# Patient Record
Sex: Female | Born: 1995 | Race: Black or African American | Hispanic: No | Marital: Single | Smoking: Former smoker
Health system: Southern US, Community
[De-identification: ages and names within clinical notes are randomized; demographics above are authoritative.]

## PROBLEM LIST (undated history)

## (undated) ENCOUNTER — Inpatient Hospital Stay (HOSPITAL_COMMUNITY): Payer: Self-pay

## (undated) ENCOUNTER — Emergency Department (HOSPITAL_COMMUNITY): Payer: MEDICAID

## (undated) DIAGNOSIS — E0590 Thyrotoxicosis, unspecified without thyrotoxic crisis or storm: Secondary | ICD-10-CM

## (undated) DIAGNOSIS — R011 Cardiac murmur, unspecified: Secondary | ICD-10-CM

## (undated) DIAGNOSIS — F419 Anxiety disorder, unspecified: Secondary | ICD-10-CM

## (undated) DIAGNOSIS — A599 Trichomoniasis, unspecified: Secondary | ICD-10-CM

## (undated) DIAGNOSIS — E079 Disorder of thyroid, unspecified: Secondary | ICD-10-CM

## (undated) HISTORY — PX: NO PAST SURGERIES: SHX2092

## (undated) HISTORY — DX: Anxiety disorder, unspecified: F41.9

---
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
## 2015-01-16 NOTE — L&D Delivery Note (Signed)
AROM'd shortly before delivery - moderate meconium. Reassuring FHRT throughout 2nd stage, occ variables.  Delivery Note At 12:52 AM a viable female "Ky'mier" was delivered via Vaginal, Spontaneous Delivery (Presentation: OA restituting to ROA). APGARS: 8, 9; weight pending.   Placenta status: Complete, intact. Cord: 3 vessels with the following complications: None. Cord pH: NA  Anesthesia: Epidural Episiotomy: None Lacerations: Right inner labial   Suture Repair: 3.0 vicryl SH Est. Blood Loss (mL): 300  Mom to postpartum.  Baby to Couplet care / Skin to Skin.  Plans outpatient circumcision.  Breast and bottle feeding.  Desires depo for contraception (ordered) - previous user.   Sherre ScarletWILLIAMS, Anaisha Mago 11/22/2015, 1:28 AM

## 2015-03-29 ENCOUNTER — Encounter (HOSPITAL_COMMUNITY): Payer: Self-pay

## 2015-03-29 ENCOUNTER — Inpatient Hospital Stay (HOSPITAL_COMMUNITY)
Admission: AD | Admit: 2015-03-29 | Discharge: 2015-03-29 | Disposition: A | Discharge status: Home / Self Care | Payer: Medicaid Other | Source: Ambulatory Visit | Attending: Obstetrics & Gynecology | Admitting: Obstetrics & Gynecology

## 2015-03-29 DIAGNOSIS — Z3201 Encounter for pregnancy test, result positive: Secondary | ICD-10-CM | POA: Insufficient documentation

## 2015-03-29 DIAGNOSIS — Z3200 Encounter for pregnancy test, result unknown: Secondary | ICD-10-CM | POA: Diagnosis present

## 2015-03-29 DIAGNOSIS — O219 Vomiting of pregnancy, unspecified: Secondary | ICD-10-CM | POA: Diagnosis not present

## 2015-03-29 LAB — URINALYSIS, ROUTINE W REFLEX MICROSCOPIC
Bilirubin Urine: NEGATIVE
Glucose, UA: NEGATIVE mg/dL
Hgb urine dipstick: NEGATIVE
Ketones, ur: NEGATIVE mg/dL
LEUKOCYTES UA: NEGATIVE
NITRITE: NEGATIVE
PH: 6.5 (ref 5.0–8.0)
Protein, ur: NEGATIVE mg/dL
Specific Gravity, Urine: 1.02 (ref 1.005–1.030)

## 2015-03-29 LAB — POCT PREGNANCY, URINE: Preg Test, Ur: POSITIVE — AB

## 2015-03-29 NOTE — MAU Provider Note (Signed)
Mackenzie Santiago is a 20 y.o. G1P0 at 125w4dwho presents to MAU today for pregnancy verification. The patient denies abdominal pain or vaginal bleedingcurrently.  Just reports mild nausea, had mild RLQ earlier today but no current symptoms.  Blood pressure 131/78, pulse 106, temperature 98.6 F (37 C), temperature source Oral, resp. rate 18, last menstrual period 02/18/2015. CONSTITUTIONAL: Well-developed, well-nourished female in no acute distress.  ENT: External right and left ear normal.  EYES: EOM intact, conjunctivae normal.  MUSCULOSKELETAL: Normal range of motion.  CARDIOVASCULAR: Regular heart rate RESPIRATORY: Normal effort NEUROLOGICAL: Alert and oriented to person, place, and time.  SKIN: Skin is warm and dry. No rash noted. Not diaphoretic. No erythema. No pallor. PSYCH: Normal mood and affect. Normal behavior. Normal judgment and thought content.  Results for orders placed or performed during the hospital encounter of 03/29/15 (from the past 24 hour(s))  Urinalysis, Routine w reflex microscopic ([facility])     Status: Abnormal   Collection Time: 03/29/15  6:20 PM  Result Value Ref Range   Color, Urine YELLOW YELLOW   APPearance HAZY (A) CLEAR   Specific Gravity, Urine 1.020 1.005 - 1.030   pH 6.5 5.0 - 8.0   Glucose, UA NEGATIVE NEGATIVE mg/dL   Hgb urine dipstick NEGATIVE NEGATIVE   Bilirubin Urine NEGATIVE NEGATIVE   Ketones, ur NEGATIVE NEGATIVE mg/dL   Protein, ur NEGATIVE NEGATIVE mg/dL   Nitrite NEGATIVE NEGATIVE   Leukocytes, UA NEGATIVE NEGATIVE  Pregnancy, urine POC     Status: Abnormal   Collection Time: 03/29/15  6:40 PM  Result Value Ref Range   Preg Test, Ur POSITIVE (A) NEGATIVE     A:Positive pregnancy test, benign exam  P:  Discharge home Pregnancy confirmation letter and list of area OB providers given Patient advised to start taking prenatal vitamins Recommended OTC Unisom and Vitamin B6 or nausea First trimester warning signs  reviewed Patient may return to MAU as needed or if her condition were to change or worsen    Tereso NewcomerUgonna A [name], MD 03/29/2015 7:53 PM

## 2015-03-29 NOTE — MAU Note (Addendum)
Pt took 4 HPT's, all positive.  C/O nausea since yesterday, no vomiting or diarrhea.  RLQ pain also since this morning. Denies this pain now.

## 2015-03-29 NOTE — Discharge Instructions (Signed)
First Trimester of Pregnancy The first trimester of pregnancy is from week 1 until the end of week 12 (months 1 through 3). A week after a sperm fertilizes an egg, the egg will implant on the wall of the uterus. This embryo will begin to develop into a baby. Genes from you and your partner are forming the baby. The female genes determine whether the baby is a boy or a girl. At 6-8 weeks, the eyes and face are formed, and the heartbeat can be seen on ultrasound. At the end of 12 weeks, all the baby's organs are formed.  Now that you are pregnant, you will want to do everything you can to have a healthy baby. Two of the most important things are to get good prenatal care and to follow your health care provider's instructions. Prenatal care is all the medical care you receive before the baby's birth. This care will help prevent, find, and treat any problems during the pregnancy and childbirth. BODY CHANGES Your body goes through many changes during pregnancy. The changes vary from woman to woman.   You may gain or lose a couple of pounds at first.  You may feel sick to your stomach (nauseous) and throw up (vomit). If the vomiting is uncontrollable, call your health care provider.  You may tire easily.  You may develop headaches that can be relieved by medicines approved by your health care provider.  You may urinate more often. Painful urination may mean you have a bladder infection.  You may develop heartburn as a result of your pregnancy.  You may develop constipation because certain hormones are causing the muscles that push waste through your intestines to slow down.  You may develop hemorrhoids or swollen, bulging veins (varicose veins).  Your breasts may begin to grow larger and become tender. Your nipples may stick out more, and the tissue that surrounds them (areola) may become darker.  Your gums may bleed and may be sensitive to brushing and flossing.  Dark spots or blotches (chloasma,  mask of pregnancy) may develop on your face. This will likely fade after the baby is born.  Your menstrual periods will stop.  You may have a loss of appetite.  You may develop cravings for certain kinds of food.  You may have changes in your emotions from day to day, such as being excited to be pregnant or being concerned that something may go wrong with the pregnancy and baby.  You may have more vivid and strange dreams.  You may have changes in your hair. These can include thickening of your hair, rapid growth, and changes in texture. Some women also have hair loss during or after pregnancy, or hair that feels dry or thin. Your hair will most likely return to normal after your baby is born. WHAT TO EXPECT AT YOUR PRENATAL VISITS During a routine prenatal visit:  You will be weighed to make sure you and the baby are growing normally.  Your blood pressure will be taken.  Your abdomen will be measured to track your baby's growth.  The fetal heartbeat will be listened to starting around week 10 or 12 of your pregnancy.  Test results from any previous visits will be discussed. Your health care provider may ask you:  How you are feeling.  If you are feeling the baby move.  If you have had any abnormal symptoms, such as leaking fluid, bleeding, severe headaches, or abdominal cramping.  If you are using any tobacco products,   including cigarettes, chewing tobacco, and electronic cigarettes.  If you have any questions. Other tests that may be performed during your first trimester include:  Blood tests to find your blood type and to check for the presence of any previous infections. They will also be used to check for low iron levels (anemia) and Rh antibodies. Later in the pregnancy, blood tests for diabetes will be done along with other tests if problems develop.  Urine tests to check for infections, diabetes, or protein in the urine.  An ultrasound to confirm the proper growth  and development of the baby.  An amniocentesis to check for possible genetic problems.  Fetal screens for spina bifida and Down syndrome.  You may need other tests to make sure you and the baby are doing well.  HIV (human immunodeficiency virus) testing. Routine prenatal testing includes screening for HIV, unless you choose not to have this test. HOME CARE INSTRUCTIONS  Medicines  Follow your health care provider's instructions regarding medicine use. Specific medicines may be either safe or unsafe to take during pregnancy.  Take your prenatal vitamins as directed.  If you develop constipation, try taking a stool softener if your health care provider approves. Diet  Eat regular, well-balanced meals. Choose a variety of foods, such as meat or vegetable-based protein, fish, milk and low-fat dairy products, vegetables, fruits, and whole grain breads and cereals. Your health care provider will help you determine the amount of weight gain that is right for you.  Avoid raw meat and uncooked cheese. These carry germs that can cause birth defects in the baby.  Eating four or five small meals rather than three large meals a day may help relieve nausea and vomiting. If you start to feel nauseous, eating a few soda crackers can be helpful. Drinking liquids between meals instead of during meals also seems to help nausea and vomiting.  If you develop constipation, eat more high-fiber foods, such as fresh vegetables or fruit and whole grains. Drink enough fluids to keep your urine clear or pale yellow. Activity and Exercise  Exercise only as directed by your health care provider. Exercising will help you:  Control your weight.  Stay in shape.  Be prepared for labor and delivery.  Experiencing pain or cramping in the lower abdomen or low back is a good sign that you should stop exercising. Check with your health care provider before continuing normal exercises.  Try to avoid standing for long  periods of time. Move your legs often if you must stand in one place for a long time.  Avoid heavy lifting.  Wear low-heeled shoes, and practice good posture.  You may continue to have sex unless your health care provider directs you otherwise. Relief of Pain or Discomfort  Wear a good support bra for breast tenderness.   Take warm sitz baths to soothe any pain or discomfort caused by hemorrhoids. Use hemorrhoid cream if your health care provider approves.   Rest with your legs elevated if you have leg cramps or low back pain.  If you develop varicose veins in your legs, wear support hose. Elevate your feet for 15 minutes, 3-4 times a day. Limit salt in your diet. Prenatal Care  Schedule your prenatal visits by the twelfth week of pregnancy. They are usually scheduled monthly at first, then more often in the last 2 months before delivery.  Write down your questions. Take them to your prenatal visits.  Keep all your prenatal visits as directed by your   health care provider. Safety  Wear your seat belt at all times when driving.  Make a list of emergency phone numbers, including numbers for family, friends, the hospital, and police and fire departments. General Tips  Ask your health care provider for a referral to a local prenatal education class. Begin classes no later than at the beginning of month 6 of your pregnancy.  Ask for help if you have counseling or nutritional needs during pregnancy. Your health care provider can offer advice or refer you to specialists for help with various needs.  Do not use hot tubs, steam rooms, or saunas.  Do not douche or use tampons or scented sanitary pads.  Do not cross your legs for long periods of time.  Avoid cat litter boxes and soil used by cats. These carry germs that can cause birth defects in the baby and possibly loss of the fetus by miscarriage or stillbirth.  Avoid all smoking, herbs, alcohol, and medicines not prescribed by  your health care provider. Chemicals in these affect the formation and growth of the baby.  Do not use any tobacco products, including cigarettes, chewing tobacco, and electronic cigarettes. If you need help quitting, ask your health care provider. You may receive counseling support and other resources to help you quit.  Schedule a dentist appointment. At home, brush your teeth with a soft toothbrush and be gentle when you floss. SEEK MEDICAL CARE IF:   You have dizziness.  You have mild pelvic cramps, pelvic pressure, or nagging pain in the abdominal area.  You have persistent nausea, vomiting, or diarrhea.  You have a bad smelling vaginal discharge.  You have pain with urination.  You notice increased swelling in your face, hands, legs, or ankles. SEEK IMMEDIATE MEDICAL CARE IF:   You have a fever.  You are leaking fluid from your vagina.  You have spotting or bleeding from your vagina.  You have severe abdominal cramping or pain.  You have rapid weight gain or loss.  You vomit blood or material that looks like coffee grounds.  You are exposed to German measles and have never had them.  You are exposed to fifth disease or chickenpox.  You develop a severe headache.  You have shortness of breath.  You have any kind of trauma, such as from a fall or a car accident.   This information is not intended to replace advice given to you by your health care provider. Make sure you discuss any questions you have with your health care provider.   Document Released: 12/26/2000 Document Revised: 01/22/2014 Document Reviewed: 11/11/2012 Elsevier Interactive Patient Education 2016 [employer]  

## 2015-04-26 LAB — OB RESULTS CONSOLE GC/CHLAMYDIA
CHLAMYDIA, DNA PROBE: NEGATIVE
Gonorrhea: NEGATIVE

## 2015-04-26 LAB — OB RESULTS CONSOLE ANTIBODY SCREEN: Antibody Screen: NEGATIVE

## 2015-04-26 LAB — OB RESULTS CONSOLE ABO/RH: RH TYPE: POSITIVE

## 2015-04-26 LAB — OB RESULTS CONSOLE RUBELLA ANTIBODY, IGM: RUBELLA: IMMUNE

## 2015-04-26 LAB — OB RESULTS CONSOLE HEPATITIS B SURFACE ANTIGEN: Hepatitis B Surface Ag: NEGATIVE

## 2015-07-17 ENCOUNTER — Encounter (HOSPITAL_COMMUNITY): Payer: Self-pay | Admitting: Emergency Medicine

## 2015-07-17 ENCOUNTER — Ambulatory Visit (HOSPITAL_COMMUNITY)
Admission: EM | Admit: 2015-07-17 | Discharge: 2015-07-17 | Disposition: A | Discharge status: Home / Self Care | Payer: Medicaid Other | Attending: Emergency Medicine | Admitting: Emergency Medicine

## 2015-07-17 DIAGNOSIS — B9689 Other specified bacterial agents as the cause of diseases classified elsewhere: Secondary | ICD-10-CM

## 2015-07-17 DIAGNOSIS — Z202 Contact with and (suspected) exposure to infections with a predominantly sexual mode of transmission: Secondary | ICD-10-CM

## 2015-07-17 DIAGNOSIS — A499 Bacterial infection, unspecified: Secondary | ICD-10-CM | POA: Diagnosis not present

## 2015-07-17 DIAGNOSIS — N760 Acute vaginitis: Secondary | ICD-10-CM | POA: Diagnosis not present

## 2015-07-17 LAB — POCT URINALYSIS DIP (DEVICE)
BILIRUBIN URINE: NEGATIVE
Glucose, UA: NEGATIVE mg/dL
HGB URINE DIPSTICK: NEGATIVE
Ketones, ur: NEGATIVE mg/dL
Leukocytes, UA: NEGATIVE
NITRITE: NEGATIVE
Protein, ur: NEGATIVE mg/dL
Specific Gravity, Urine: 1.025 (ref 1.005–1.030)
UROBILINOGEN UA: 0.2 mg/dL (ref 0.0–1.0)
pH: 6 (ref 5.0–8.0)

## 2015-07-17 MED ORDER — AZITHROMYCIN 250 MG PO TABS
ORAL_TABLET | ORAL | Status: AC
  Filled 2015-07-17: qty 4

## 2015-07-17 MED ORDER — AZITHROMYCIN 250 MG PO TABS
1000.0000 mg | ORAL_TABLET | Freq: Once | ORAL | Status: AC
  Administered 2015-07-17: 1000 mg via ORAL

## 2015-07-17 MED ORDER — METRONIDAZOLE 0.75 % VA GEL: 1.0000 | Freq: Two times a day (BID) | VAGINAL | Status: DC

## 2015-07-17 NOTE — ED Provider Notes (Signed)
CSN: [id number]     Arrival date & time 07/17/15  1158 History   First MD Initiated Contact with Patient 07/17/15 1214     Chief Complaint  Patient presents with  . SEXUALLY TRANSMITTED DISEASE   (Consider location/radiation/quality/duration/timing/severity/associated sxs/prior Treatment) HPI Brief exposure to STD. She is [redacted] weeks pregnant. She was seen at her OB/GYN last week and found to have chlamydia. She was treated with what sounds like azithromycin. She reports having sex with her partner last night and the condom broke. She is concerned about being reexposed to chlamydia as her partner has not yet been treated. She is also currently taking Flagyl for BV.  She states it's really hard for her to take the Flagyl and she often throws up afterwards.  She also reports some increased vaginal discharge, but not foul-smelling at this time. She also reports intermittent cloudy urine.  No contractions or vaginal bleeding.  Past Medical History  Diagnosis Date  . Thyroid disease   . Heart murmur   . Trichimoniasis    History reviewed. No pertinent past surgical history. No family history on file. Social History  Substance Use Topics  . Smoking status: Current Every Day Smoker    Types: Cigarettes  . Smokeless tobacco: None  . Alcohol Use: No     Comment: social   OB History    Gravida Para Term Preterm AB TAB SAB Ectopic Multiple Living   1              Review of Systems As in history of present illness Allergies  Blueberry  Home Medications   Prior to Admission medications   Medication Sig Start Date End Date Taking? Authorizing Provider  metroNIDAZOLE (FLAGYL) 500 MG tablet Take 500 mg by mouth 3 (three) times daily.   Yes Historical Provider, MD  ibuprofen (ADVIL,MOTRIN) 600 MG tablet Take 1 tablet (600 mg total) by mouth every 6 (six) hours as needed. 05/07/14   Tatyana Kirichenko, PA-C  metroNIDAZOLE (METROGEL VAGINAL) 0.75 % vaginal gel Place 1 Applicatorful vaginally 2  (two) times daily. For 7 days 07/17/15   Charm RingsErin J Giorgia Wahler, MD  Pseudoeph-Doxylamine-DM-APAP (NYQUIL PO) Take 1 Dose by mouth as needed (cold symptoms).    Historical Provider, MD   Meds Ordered and Administered this Visit   Medications  azithromycin (ZITHROMAX) tablet 1,000 mg (1,000 mg Oral Given 07/17/15 1233)    BP 109/67 mmHg  Pulse 99  Temp(Src) 98.3 F (36.8 C) (Oral)  Resp 18  SpO2 100%  LMP 02/18/2015 No data found.   Physical Exam  Constitutional: She is oriented to person, place, and time. She appears well-developed and well-nourished. No distress.  Cardiovascular: Normal rate.   Pulmonary/Chest: Effort normal.  Neurological: She is alert and oriented to person, place, and time.    ED Course  Procedures (including critical care time)  Labs Review Labs Reviewed  POCT URINALYSIS DIP (DEVICE)    Imaging Review No results found.   MDM   1. STD exposure   2. BV (bacterial vaginosis)    Will retreat for chlamydia with azithromycin here. As she has difficulty taking the oral Flagyl, I have provided a prescription for MetroGel. Discussed that the oral medicine is at her medicine to take. Also discussed that I don't know if her insurance will cover the MetroGel. Follow-up with OB/GYN as scheduled.    Charm RingsErin J Madix Blowe, MD 07/17/15 1332

## 2015-07-17 NOTE — ED Notes (Signed)
Patient reports testing positive for chlamydia.  Reports taking "2 red pills".  Then she had a condom break with this same partner.  Patient here today for treatment.

## 2015-07-17 NOTE — Discharge Instructions (Signed)
We retreated you for chlamydia today. No sex for one week. If you cannot take the oral Flagyl, you can use the MetroGel twice a day for 1 week.  The oral medicine is better, but if you cannot keep it down, it doesn't do anything. Follow-up with your OB/GYN as scheduled.

## 2015-07-28 ENCOUNTER — Emergency Department (HOSPITAL_COMMUNITY)
Admission: EM | Admit: 2015-07-28 | Discharge: 2015-07-28 | Disposition: A | Discharge status: Home / Self Care | Payer: Medicaid Other | Attending: Emergency Medicine | Admitting: Emergency Medicine

## 2015-07-28 ENCOUNTER — Encounter (HOSPITAL_COMMUNITY): Payer: Self-pay | Admitting: Emergency Medicine

## 2015-07-28 DIAGNOSIS — O99332 Smoking (tobacco) complicating pregnancy, second trimester: Secondary | ICD-10-CM | POA: Insufficient documentation

## 2015-07-28 DIAGNOSIS — N898 Other specified noninflammatory disorders of vagina: Secondary | ICD-10-CM | POA: Diagnosis not present

## 2015-07-28 DIAGNOSIS — Z79899 Other long term (current) drug therapy: Secondary | ICD-10-CM | POA: Insufficient documentation

## 2015-07-28 DIAGNOSIS — R109 Unspecified abdominal pain: Secondary | ICD-10-CM

## 2015-07-28 DIAGNOSIS — O9989 Other specified diseases and conditions complicating pregnancy, childbirth and the puerperium: Secondary | ICD-10-CM | POA: Insufficient documentation

## 2015-07-28 DIAGNOSIS — Z3A22 22 weeks gestation of pregnancy: Secondary | ICD-10-CM | POA: Insufficient documentation

## 2015-07-28 DIAGNOSIS — F17210 Nicotine dependence, cigarettes, uncomplicated: Secondary | ICD-10-CM | POA: Diagnosis not present

## 2015-07-28 DIAGNOSIS — O26892 Other specified pregnancy related conditions, second trimester: Secondary | ICD-10-CM

## 2015-07-28 DIAGNOSIS — O26899 Other specified pregnancy related conditions, unspecified trimester: Secondary | ICD-10-CM

## 2015-07-28 LAB — URINALYSIS, ROUTINE W REFLEX MICROSCOPIC
BILIRUBIN URINE: NEGATIVE
Glucose, UA: NEGATIVE mg/dL
Hgb urine dipstick: NEGATIVE
Ketones, ur: NEGATIVE mg/dL
LEUKOCYTES UA: NEGATIVE
NITRITE: NEGATIVE
PROTEIN: NEGATIVE mg/dL
Specific Gravity, Urine: 1.015 (ref 1.005–1.030)
pH: 7 (ref 5.0–8.0)

## 2015-07-28 MED ORDER — AZITHROMYCIN 250 MG PO TABS
1000.0000 mg | ORAL_TABLET | Freq: Once | ORAL | Status: AC
Start: 2015-07-28 — End: 2015-07-28
  Administered 2015-07-28: 1000 mg via ORAL
  Filled 2015-07-28: qty 4

## 2015-07-28 MED ORDER — METRONIDAZOLE 500 MG PO TABS: 500.0000 mg | ORAL_TABLET | Freq: Two times a day (BID) | ORAL | Status: DC

## 2015-07-28 MED ORDER — METRONIDAZOLE 500 MG PO TABS
500.0000 mg | ORAL_TABLET | Freq: Once | ORAL | Status: AC
  Administered 2015-07-28: 500 mg via ORAL
  Filled 2015-07-28: qty 1

## 2015-07-28 MED ORDER — LIDOCAINE HCL (PF) 1 % IJ SOLN
INTRAMUSCULAR | Status: AC
  Filled 2015-07-28: qty 5

## 2015-07-28 MED ORDER — CEFTRIAXONE SODIUM 250 MG IJ SOLR
250.0000 mg | Freq: Once | INTRAMUSCULAR | Status: AC
  Administered 2015-07-28: 250 mg via INTRAMUSCULAR
  Filled 2015-07-28: qty 250

## 2015-07-28 NOTE — Progress Notes (Signed)
Called to come to [facility] for a G1P0 22.6 weeks came in complaining of cramping and watery discharge. Pt states cramping got better with hydration and discharge started this morning, pt has been treated for chlaymidia on 6/28. Dopplered FHR 152, baby moving well, denies any bleeding. Dr Normand Sloopillard called informed of pt status, OB cleared.

## 2015-07-28 NOTE — ED Notes (Signed)
Rapid response OB called to see pt

## 2015-07-28 NOTE — ED Notes (Signed)
Notified OB Rapid Response 

## 2015-07-28 NOTE — ED Notes (Addendum)
22 week ob pt G1  P 0 A0 L0 c/o left sided abd cramping and clear white d/c after oral sex she states  Started  4 30 this am, no n/v/bleeding, she states saw her OB dr 6/28 edc is 11/10 was tx for BV and chlaymidia  With her partner  On 6/28,

## 2015-07-28 NOTE — Discharge Instructions (Signed)
Please be sure to follow-up with your obstetrician as scheduled.  Take all medication as directed. Return to the women's health clinic for changes in your condition.

## 2015-07-28 NOTE — ED Provider Notes (Signed)
CSN: [id number]     Arrival date & time 07/28/15  0715 History   First MD Initiated Contact with Patient 07/28/15 0719     Chief Complaint  Patient presents with  . Abdominal Cramping     HPI  Patient presents with concern of abdominal cramping, clear/white vaginal discharge. Patient is a notable history of pregnancy, is approximately [redacted] weeks pregnant, follows with a local obstetrician. She notes that today, within the past 3 hours she has developed brief episodes of sharp crampy pain throughout her lower abdomen. She also began to have clear, painless, odorless discharge. She has a notable history of recent diagnosis of chlamydia, completed treatment, did not wait prescribed amount of time before having intercourse. She notes that other than her abdominal pain the pregnancy is unremarkable thus far. Since onset of pain, no interventions taken, and she has not spoken with her obstetrician.   Past Medical History  Diagnosis Date  . Thyroid disease   . Heart murmur   . Trichimoniasis    History reviewed. No pertinent past surgical history. No family history on file. Social History  Substance Use Topics  . Smoking status: Current Every Day Smoker    Types: Cigarettes  . Smokeless tobacco: None  . Alcohol Use: No     Comment: social   OB History    Gravida Para Term Preterm AB TAB SAB Ectopic Multiple Living   1              Review of Systems  Constitutional:       Per HPI, otherwise negative  HENT:       Per HPI, otherwise negative  Respiratory:       Per HPI, otherwise negative  Cardiovascular:       Per HPI, otherwise negative  Gastrointestinal: Negative for vomiting.  Endocrine:       Negative aside from HPI  Genitourinary:       Neg aside from HPI   Musculoskeletal:       Per HPI, otherwise negative  Skin: Negative.   Allergic/Immunologic: Negative for immunocompromised state.  Neurological: Negative for syncope.      Allergies  Blueberry  Home  Medications   Prior to Admission medications   Medication Sig Start Date End Date Taking? Authorizing Provider  ibuprofen (ADVIL,MOTRIN) 600 MG tablet Take 1 tablet (600 mg total) by mouth every 6 (six) hours as needed. 05/07/14   Tatyana Kirichenko, PA-C  metroNIDAZOLE (FLAGYL) 500 MG tablet Take 500 mg by mouth 3 (three) times daily.    Historical Provider, MD  metroNIDAZOLE (METROGEL VAGINAL) 0.75 % vaginal gel Place 1 Applicatorful vaginally 2 (two) times daily. For 7 days 07/17/15   Charm RingsErin J Honig, MD  Pseudoeph-Doxylamine-DM-APAP (NYQUIL PO) Take 1 Dose by mouth as needed (cold symptoms).    Historical Provider, MD   BP 128/92 mmHg  Pulse 94  Temp(Src) 98 F (36.7 C) (Oral)  Resp 14  SpO2 96%  LMP 02/18/2015 Physical Exam  Constitutional: She is oriented to person, place, and time. She appears well-developed and well-nourished. No distress.  HENT:  Head: Normocephalic and atraumatic.  Eyes: Conjunctivae and EOM are normal.  Cardiovascular: Normal rate and regular rhythm.   Pulmonary/Chest: Effort normal and breath sounds normal. No stridor. No respiratory distress.  Abdominal: She exhibits no distension.  Soft, nontender, but firm lower abdomen Patient denies pain currently, states that her cramping has improved substantially.   Musculoskeletal: She exhibits no edema.  Neurological: She is alert  and oriented to person, place, and time. No cranial nerve deficit.  Skin: Skin is warm and dry.  Psychiatric: She has a normal mood and affect.  Nursing note and vitals reviewed.   ED Course  Procedures (including critical care time) Labs Review Labs Reviewed  URINALYSIS, ROUTINE W REFLEX MICROSCOPIC (NOT AT [facility]) - Abnormal; Notable for the following:    APPearance HAZY (*)    All other components within normal limits   I have personally reviewed and evaluated these lab results as part of my medical decision-making.  After my initial evaluation we called our OB rapid response  team for assessment.  Chart review notable for recent evaluation of STD   Patient has been evaluated by rapid response, see independent note. Essentially, no evidence for fetal distress. We discussed the patient's case with her obstetrician, and was clinical concern for recurrent chlamydia, the patient will have antibiotics, follow up in clinic. MDM  Pregnant female presents with new cramping sensation, as well as discharge. Notably, the patient recently was treated for chlamydia, but seemingly did not follow home care instructions completely. Here, no evidence for other acute abdominal processes, no evidence for PID, with minimal abdominal tenderness. With ongoing discharge, some suspicion for recurrent infection. Patient started empiric therapy, will follow up with clinic.  Gerhard Munch, MD 07/28/15 [phone]
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
## 2015-08-03 ENCOUNTER — Encounter (HOSPITAL_COMMUNITY): Payer: Self-pay | Admitting: *Deleted

## 2015-08-03 ENCOUNTER — Inpatient Hospital Stay (HOSPITAL_COMMUNITY)
Admission: AD | Admit: 2015-08-03 | Discharge: 2015-08-03 | Disposition: A | Discharge status: Home / Self Care | Payer: Medicaid Other | Source: Ambulatory Visit | Attending: Obstetrics and Gynecology | Admitting: Obstetrics and Gynecology

## 2015-08-03 DIAGNOSIS — F17200 Nicotine dependence, unspecified, uncomplicated: Secondary | ICD-10-CM | POA: Insufficient documentation

## 2015-08-03 DIAGNOSIS — O26892 Other specified pregnancy related conditions, second trimester: Secondary | ICD-10-CM | POA: Diagnosis not present

## 2015-08-03 DIAGNOSIS — N898 Other specified noninflammatory disorders of vagina: Secondary | ICD-10-CM | POA: Insufficient documentation

## 2015-08-03 DIAGNOSIS — O99332 Smoking (tobacco) complicating pregnancy, second trimester: Secondary | ICD-10-CM | POA: Diagnosis not present

## 2015-08-03 DIAGNOSIS — O368120 Decreased fetal movements, second trimester, not applicable or unspecified: Secondary | ICD-10-CM

## 2015-08-03 DIAGNOSIS — Z3A23 23 weeks gestation of pregnancy: Secondary | ICD-10-CM | POA: Insufficient documentation

## 2015-08-03 DIAGNOSIS — N949 Unspecified condition associated with female genital organs and menstrual cycle: Secondary | ICD-10-CM

## 2015-08-03 DIAGNOSIS — O2692 Pregnancy related conditions, unspecified, second trimester: Secondary | ICD-10-CM | POA: Insufficient documentation

## 2015-08-03 LAB — URINALYSIS, ROUTINE W REFLEX MICROSCOPIC
Bilirubin Urine: NEGATIVE
GLUCOSE, UA: 250 mg/dL — AB
Hgb urine dipstick: NEGATIVE
Ketones, ur: NEGATIVE mg/dL
LEUKOCYTES UA: NEGATIVE
Nitrite: NEGATIVE
PROTEIN: NEGATIVE mg/dL
Specific Gravity, Urine: <1.005 — ABNORMAL LOW (ref 1.005–1.030)
pH: 6 (ref 5.0–8.0)

## 2015-08-03 LAB — WET PREP, GENITAL
Clue Cells Wet Prep HPF POC: NONE SEEN
SPERM: NONE SEEN
Trich, Wet Prep: NONE SEEN
YEAST WET PREP: NONE SEEN

## 2015-08-03 LAB — FETAL FIBRONECTIN: Fetal Fibronectin: NEGATIVE

## 2015-08-03 NOTE — Discharge Instructions (Signed)

## 2015-08-03 NOTE — MAU Provider Note (Signed)
Chief Complaint:  Vaginal Discharge  Provider saw patient at 1840 hrs    Mackenzie Santiago is a 20 y.o. G1P0 at 16w5dwho presents via EMS to maternity admissions reporting watery vaginal discharge and decreased fetal movement for 3 days.  Has been evaluated for discharge recently and found to have BV.  Did not take all of her Flagyl.  Took the whole bottle but spent about 10+ days to do it.  Did not take as prescribed. She reports good fetal movement, denies vaginal bleeding, vaginal itching/burning, urinary symptoms, h/a, dizziness, n/v, diarrhea, constipation or fever/chills.  She denies headache, visual changes or RUQ abdominal pain.  Vaginal Discharge The patient's primary symptoms include vaginal discharge. The patient's pertinent negatives include no genital itching, genital odor, genital rash, pelvic pain or vaginal bleeding. This is a recurrent problem. The current episode started in the past 7 days. The problem occurs constantly. The problem has been unchanged. The patient is experiencing no pain. The problem affects both sides. She is pregnant. Pertinent negatives include no abdominal pain, back pain, constipation, diarrhea, dysuria, fever, frequency, headaches, nausea or vomiting. The vaginal discharge was milky. There has been no bleeding. She has not been passing clots. She has not been passing tissue. Nothing aggravates the symptoms. She has tried nothing for the symptoms.   RN Note: Pt arrived via EMS with watery vaginal discharge and is feeling vaginal pressure. Pt states she has not been feeling the baby move for three days. Pt states she was recently at [facility] where she was told she had BV and an STD. Pt states the medicine for the BV made her sick so she did not take it but she took the STD medication.  Past Medical History: Past Medical History  Diagnosis Date  . Thyroid disease   . Heart murmur   . Trichimoniasis     Past obstetric history: OB History  Gravida Para Term  Preterm AB SAB TAB Ectopic Multiple Living  1             # Outcome Date GA Lbr Len/2nd Weight Sex Delivery Anes PTL Lv  1 Current               Past Surgical History: Past Surgical History  Procedure Laterality Date  . No past surgeries      Family History: History reviewed. No pertinent family history.  Social History: Social History  Substance Use Topics  . Smoking status: Current Every Day Smoker    Types: Cigarettes  . Smokeless tobacco: None  . Alcohol Use: No     Comment: social    Allergies:  Allergies  Allergen Reactions  . Blueberry [Vaccinium Angustifolium]     Hives      Meds:  Prescriptions prior to admission  Medication Sig Dispense Refill Last Dose  . metroNIDAZOLE (FLAGYL) 500 MG tablet Take 1 tablet (500 mg total) by mouth 2 (two) times daily. 14 tablet 0     I have reviewed patient's Past Medical Hx, Surgical Hx, Family Hx, Social Hx, medications and allergies.   ROS:  Review of Systems  Constitutional: Negative for fever.  Gastrointestinal: Negative for nausea, vomiting, abdominal pain, diarrhea and constipation.  Genitourinary: Positive for vaginal discharge. Negative for dysuria, frequency and pelvic pain.       No fetal movement felt in 3 days, does feel it now  Musculoskeletal: Negative for back pain.  Neurological: Negative for headaches.   Other systems negative  Physical Exam  Patient Vitals for  the past 24 hrs:  BP Temp Temp src Pulse Resp  08/03/15 1912 (!) 104/53 mmHg - - 84 16  08/03/15 1827 109/56 mmHg 98.1 F (36.7 C) Oral 83 16   Constitutional: Well-developed, well-nourished female in no acute distress.  Cardiovascular: normal rate and rhythm Respiratory: normal effort, clear to auscultation bilaterally GI: Abd soft, non-tender, gravid appropriate for gestational age.   No rebound or guarding. MS: Extremities nontender, no edema, normal ROM Neurologic: Alert and oriented x 4.  GU: Neg CVAT.  PELVIC EXAM: Cervix  pink, visually closed, without lesion, scant white creamy discharge, vaginal walls and external genitalia normal Bimanual exam: Cervix firm, posterior, neg CMT, uterus nontender, Fundal Height consistent with dates, adnexa without tenderness, enlargement, or mass  Dilation: Closed Effacement (%): Thick Cervical Position: Posterior Exam by:: Wynelle Bourgeois, CNM  Fetus very active, audible   Patient admits to feeling a lot of movement now FHT:  Baseline 150 , moderate variability, accelerations present, no decelerations Contractions:  Irregular irritability   Labs:    Results for orders placed or performed during the hospital encounter of 08/03/15 (from the past 72 hour(s))  Wet prep, genital     Status: Abnormal   Collection Time: 08/03/15  6:40 PM  Result Value Ref Range   Yeast Wet Prep HPF POC NONE SEEN NONE SEEN   Trich, Wet Prep NONE SEEN NONE SEEN   Clue Cells Wet Prep HPF POC NONE SEEN NONE SEEN   WBC, Wet Prep HPF POC FEW (A) NONE SEEN    Comment: MODERATE BACTERIA SEEN   Sperm NONE SEEN   Fetal fibronectin     Status: None   Collection Time: 08/03/15  6:40 PM  Result Value Ref Range   Fetal Fibronectin NEGATIVE NEGATIVE  Urinalysis, Routine w reflex microscopic (not at [facility])     Status: Abnormal   Collection Time: 08/03/15  6:43 PM  Result Value Ref Range   Color, Urine YELLOW YELLOW   APPearance CLEAR CLEAR   Specific Gravity, Urine <1.005 (L) 1.005 - 1.030   pH 6.0 5.0 - 8.0   Glucose, UA 250 (A) NEGATIVE mg/dL   Hgb urine dipstick NEGATIVE NEGATIVE   Bilirubin Urine NEGATIVE NEGATIVE   Ketones, ur NEGATIVE NEGATIVE mg/dL   Protein, ur NEGATIVE NEGATIVE mg/dL   Nitrite NEGATIVE NEGATIVE   Leukocytes, UA NEGATIVE NEGATIVE    Comment: MICROSCOPIC NOT DONE ON URINES WITH NEGATIVE PROTEIN, BLOOD, LEUKOCYTES, NITRITE, OR GLUCOSE <1000 mg/dL.     Imaging:  No results found.  MAU Course/MDM: I have ordered labs and reviewed results.  NST reviewed Consult Dr  Normand Sloop with presentation, exam findings and test results.  Treatments in MAU included fetal monitoring.    Assessment: SIUP at [redacted]w[redacted]d  Decreased fetal movement, now resolved Vaginal discharge Resolved bacterial vaginosis  Plan: Discharge home Discussed normal expected FM patterns at this gestational age.   Reviewed results with patient who is relieved to not have to take more Flagyl Preterm Labor precautions and fetal kick counts Follow up in Office for prenatal visits and recheck of status    Medication List    ASK your doctor about these medications        metroNIDAZOLE 500 MG tablet  Commonly known as:  FLAGYL  Take 1 tablet (500 mg total) by mouth 2 (two) times daily.       Pt stable at time of discharge.  Encouraged to return here or to other Urgent Care/ED if she develops worsening of  symptoms, increase in pain, fever, or other concerning symptoms.      Wynelle BourgeoisMarie Williams CNM, MSN Certified Nurse-Midwife 08/03/2015 7:17 PM

## 2015-08-03 NOTE — MAU Note (Signed)
Pt arrived via EMS with watery vaginal discharge and is feeling vaginal pressure.  Pt states she has not been feeling the baby move for three days.  [facility] had BV and an STD.  Pt states the medicine for the BV made her sick so she did not take it but she took the STD medication.

## 2015-08-04 LAB — GC/CHLAMYDIA PROBE AMP (~~LOC~~) NOT AT ARMC
Chlamydia: NEGATIVE
NEISSERIA GONORRHEA: NEGATIVE

## 2015-08-12 LAB — OB RESULTS CONSOLE GC/CHLAMYDIA: GC PROBE AMP, GENITAL: NEGATIVE

## 2015-09-06 LAB — OB RESULTS CONSOLE RPR: RPR: NONREACTIVE

## 2015-09-06 LAB — OB RESULTS CONSOLE HIV ANTIBODY (ROUTINE TESTING): HIV: NONREACTIVE

## 2015-09-14 ENCOUNTER — Encounter (HOSPITAL_COMMUNITY): Payer: Self-pay | Admitting: *Deleted

## 2015-09-14 ENCOUNTER — Inpatient Hospital Stay (HOSPITAL_COMMUNITY): Payer: Medicaid Other

## 2015-09-14 ENCOUNTER — Inpatient Hospital Stay (HOSPITAL_COMMUNITY)
Admission: AD | Admit: 2015-09-14 | Discharge: 2015-09-14 | Disposition: A | Discharge status: Home / Self Care | Payer: Medicaid Other | Source: Ambulatory Visit | Attending: Obstetrics and Gynecology | Admitting: Obstetrics and Gynecology

## 2015-09-14 DIAGNOSIS — O99333 Smoking (tobacco) complicating pregnancy, third trimester: Secondary | ICD-10-CM | POA: Diagnosis not present

## 2015-09-14 DIAGNOSIS — F17210 Nicotine dependence, cigarettes, uncomplicated: Secondary | ICD-10-CM | POA: Insufficient documentation

## 2015-09-14 DIAGNOSIS — Z3A29 29 weeks gestation of pregnancy: Secondary | ICD-10-CM

## 2015-09-14 DIAGNOSIS — S3992XA Unspecified injury of lower back, initial encounter: Secondary | ICD-10-CM | POA: Diagnosis not present

## 2015-09-14 DIAGNOSIS — S3981XA Other specified injuries of abdomen, initial encounter: Secondary | ICD-10-CM | POA: Diagnosis not present

## 2015-09-14 DIAGNOSIS — O26893 Other specified pregnancy related conditions, third trimester: Secondary | ICD-10-CM | POA: Insufficient documentation

## 2015-09-14 DIAGNOSIS — O9A213 Injury, poisoning and certain other consequences of external causes complicating pregnancy, third trimester: Secondary | ICD-10-CM | POA: Diagnosis not present

## 2015-09-14 DIAGNOSIS — Z79899 Other long term (current) drug therapy: Secondary | ICD-10-CM | POA: Insufficient documentation

## 2015-09-14 DIAGNOSIS — N858 Other specified noninflammatory disorders of uterus: Secondary | ICD-10-CM

## 2015-09-14 DIAGNOSIS — M545 Low back pain: Secondary | ICD-10-CM | POA: Insufficient documentation

## 2015-09-14 DIAGNOSIS — O26879 Cervical shortening, unspecified trimester: Secondary | ICD-10-CM

## 2015-09-14 DIAGNOSIS — O26873 Cervical shortening, third trimester: Secondary | ICD-10-CM | POA: Insufficient documentation

## 2015-09-14 DIAGNOSIS — S3991XA Unspecified injury of abdomen, initial encounter: Secondary | ICD-10-CM

## 2015-09-14 DIAGNOSIS — N859 Noninflammatory disorder of uterus, unspecified: Secondary | ICD-10-CM

## 2015-09-14 LAB — FETAL FIBRONECTIN: Fetal Fibronectin: NEGATIVE
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
## 2015-09-14 NOTE — Discharge Instructions (Signed)
Blunt Abdominal Trauma °Blunt abdominal trauma is a type of injury that involves damage to the abdominal wall or to abdominal organs, such as the liver or spleen. The damage can involve bruising, tearing, or a rupture. This type of injury does not involve a puncture of the skin. °Blunt abdominal trauma can range from mild to severe. In some cases it can lead to a severe abdominal inflammation (peritonitis), severe bleeding, and a dangerous drop in blood pressure. °CAUSES °This injury is caused by a hard, direct hit to the abdomen. It can happen after: °· A motor vehicle accident. °· Being kicked or punched in the abdomen. °· Falling from a significant height. °RISK FACTORS °This injury is more likely to happen in people who: °· Play contact sports. °· Work in a job in which falls or injuries are more likely, such as in construction. °SYMPTOMS °The main symptom of this condition is pain in the abdomen. Other symptoms depend on the type and location of the injury. They can include: °· Abdominal pain that spreads to the the back or shoulder. °· Bruising. °· Swelling. °· Pain when pressing on the abdomen. °· Blood in the urine. °· Weakness. °· Confusion. °· Loss of consciousness. °· Pale, dusky, cool, or sweaty skin. °· Vomiting blood. °· Bloody stool or bleeding from the rectum. °· Trouble breathing. °Symptoms of this injury can develop suddenly or slowly.  °DIAGNOSIS °This injury is diagnosed based on your symptoms and a physical exam. You may also have tests, including: °· Blood tests. °· Urine tests. °· Imaging tests, such as: °¨ A CT scan and ultrasound of your abdomen. °¨ X-rays of your chest and abdomen. °· A test in which a tube is used to flush your abdomen with fluid and check for blood (diagnostic peritoneal lavage). °TREATMENT °Treatment for this injury depends on its type and severity. Treatment options include: °· Observation. If the injury is mild, this may be the only treatment needed. °· Support of your  blood pressure and breathing. °· Getting blood, fluids, or medicine through an IV tube. °· Antibiotic medicine. °· Insertion of tubes into the stomach or bladder. °· A blood transfusion. °· A procedure to stop bleeding. This involves putting a long, thin tube (catheter) into one of your blood vessels (angiographic embolization). °· Surgery to open up your abdomen and control bleeding or repair damage (laparotomy). This may be done if tests suggest that you have peritonitis or bleeding that cannot be controlled with angiographic embolization. °HOME CARE INSTRUCTIONS °· Take medicines only as directed by your health care provider. °· If you were prescribed an antibiotic medicine, finish all of it even if you start to feel better. °· Follow your health care provider's instructions about diet and activity restrictions. °· Keep all follow-up visits as directed by your health care provider. This is important. °SEEK MEDICAL CARE IF: °· You continue to have abdominal pain. °· Your symptoms return. °· You develop new symptoms. °· You have blood in your urine or your bowel movements. °SEEK IMMEDIATE MEDICAL CARE IF: °· You vomit blood. °· You have heavy bleeding from your rectum. °· You have very bad abdominal pain. °· You have trouble breathing. °· You have chest pain. °· You have a fever. °· You have dizziness. °· You pass out. °  °This information is not intended to replace advice given to you by your health care provider. Make sure you discuss any questions you have with your health care provider. °  °Document Released:   02/09/2004 Document Revised: 05/18/2014 Document Reviewed: 12/23/2013 Elsevier Interactive Patient Education [employer] Third Trimester of Pregnancy The third trimester is from week 29 through week 42, months 7 through 9. The third trimester is a time when the fetus is growing rapidly. At the end of the ninth month, the fetus is about 20 inches in length and weighs 6-10 pounds.  BODY  CHANGES Your body goes through many changes during pregnancy. The changes vary from woman to woman.   Your weight will continue to increase. You can expect to gain 25-35 pounds (11-16 kg) by the end of the pregnancy.  You may begin to get stretch marks on your hips, abdomen, and breasts.  You may urinate more often because the fetus is moving lower into your pelvis and pressing on your bladder.  You may develop or continue to have heartburn as a result of your pregnancy.  You may develop constipation because certain hormones are causing the muscles that push waste through your intestines to slow down.  You may develop hemorrhoids or swollen, bulging veins (varicose veins).  You may have pelvic pain because of the weight gain and pregnancy hormones relaxing your joints between the bones in your pelvis. Backaches may result from overexertion of the muscles supporting your posture.  You may have changes in your hair. These can include thickening of your hair, rapid growth, and changes in texture. Some women also have hair loss during or after pregnancy, or hair that feels dry or thin. Your hair will most likely return to normal after your baby is born.  Your breasts will continue to grow and be tender. A yellow discharge may leak from your breasts called colostrum.  Your belly button may stick out.  You may feel short of breath because of your expanding uterus.  You may notice the fetus "dropping," or moving lower in your abdomen.  You may have a bloody mucus discharge. This usually occurs a few days to a week before labor begins.  Your cervix becomes thin and soft (effaced) near your due date. WHAT TO EXPECT AT YOUR PRENATAL EXAMS  You will have prenatal exams every 2 weeks until week 36. Then, you will have weekly prenatal exams. During a routine prenatal visit:  You will be weighed to make sure you and the fetus are growing normally.  Your blood pressure is taken.  Your abdomen  will be measured to track your baby's growth.  The fetal heartbeat will be listened to.  Any test results from the previous visit will be discussed.  You may have a cervical check near your due date to see if you have effaced. At around 36 weeks, your caregiver will check your cervix. At the same time, your caregiver will also perform a test on the secretions of the vaginal tissue. This test is to determine if a type of bacteria, Group B streptococcus, is present. Your caregiver will explain this further. Your caregiver may ask you:  What your birth plan is.  How you are feeling.  If you are feeling the baby move.  If you have had any abnormal symptoms, such as leaking fluid, bleeding, severe headaches, or abdominal cramping.  If you are using any tobacco products, including cigarettes, chewing tobacco, and electronic cigarettes.  If you have any questions. Other tests or screenings that may be performed during your third trimester include:  Blood tests that check for low iron levels (anemia).  Fetal testing to check the health, activity level, and  and growth of the fetus. Testing is done if you have certain medical conditions or if there are problems during the pregnancy. °· HIV (human immunodeficiency virus) testing. If you are at high risk, you may be screened for HIV during your third trimester of pregnancy. °FALSE LABOR °You may feel small, irregular contractions that eventually go away. These are called Braxton Hicks contractions, or false labor. Contractions may last for hours, days, or even weeks before true labor sets in. If contractions come at regular intervals, intensify, or become painful, it is best to be seen by your caregiver.  °SIGNS OF LABOR  °· Menstrual-like cramps. °· Contractions that are 5 minutes apart or less. °· Contractions that start on the top of the uterus and spread down to the lower abdomen and back. °· A sense of increased pelvic pressure or back pain. °· A  watery or bloody mucus discharge that comes from the vagina. °If you have any of these signs before the 37th week of pregnancy, call your caregiver right away. You need to go to the hospital to get checked immediately. °HOME CARE INSTRUCTIONS  °· Avoid all smoking, herbs, alcohol, and unprescribed drugs. These chemicals affect the formation and growth of the baby. °· Do not use any tobacco products, including cigarettes, chewing tobacco, and electronic cigarettes. If you need help quitting, ask your health care provider. You may receive counseling support and other resources to help you quit. °· Follow your caregiver's instructions regarding medicine use. There are medicines that are either safe or unsafe to take during pregnancy. °· Exercise only as directed by your caregiver. Experiencing uterine cramps is a good sign to stop exercising. °· Continue to eat regular, healthy meals. °· Wear a good support bra for breast tenderness. °· Do not use hot tubs, steam rooms, or saunas. °· Wear your seat belt at all times when driving. °· Avoid raw meat, uncooked cheese, cat litter boxes, and soil used by cats. These carry germs that can cause birth defects in the baby. °· Take your prenatal vitamins. °· Take 1500-2000 mg of calcium daily starting at the 20th week of pregnancy until you deliver your baby. °· Try taking a stool softener (if your caregiver approves) if you develop constipation. Eat more high-fiber foods, such as fresh vegetables or fruit and whole grains. Drink plenty of fluids to keep your urine clear or pale yellow. °· Take warm sitz baths to soothe any pain or discomfort caused by hemorrhoids. Use hemorrhoid cream if your caregiver approves. °· If you develop varicose veins, wear support hose. Elevate your feet for 15 minutes, 3-4 times a day. Limit salt in your diet. °· Avoid heavy lifting, wear low heal shoes, and practice good posture. °· Rest a lot with your legs elevated if you have leg cramps or low  back pain. °· Visit your dentist if you have not gone during your pregnancy. Use a soft toothbrush to brush your teeth and be gentle when you floss. °· A sexual relationship may be continued unless your caregiver directs you otherwise. °· Do not travel far distances unless it is absolutely necessary and only with the approval of your caregiver. °· Take prenatal classes to understand, practice, and ask questions about the labor and delivery. °· Make a trial run to the hospital. °· Pack your hospital bag. °· Prepare the baby's nursery. °· Continue to go to all your prenatal visits as directed by your caregiver. °SEEK MEDICAL CARE IF: °· You are unsure if you   are in labor or if your water has broken. °· You have dizziness. °· You have mild pelvic cramps, pelvic pressure, or nagging pain in your abdominal area. °· You have persistent nausea, vomiting, or diarrhea. °· You have a bad smelling vaginal discharge. °· You have pain with urination. °SEEK IMMEDIATE MEDICAL CARE IF:  °· You have a fever. °· You are leaking fluid from your vagina. °· You have spotting or bleeding from your vagina. °· You have severe abdominal cramping or pain. °· You have rapid weight loss or gain. °· You have shortness of breath with chest pain. °· You notice sudden or extreme swelling of your face, hands, ankles, feet, or legs. °· You have not felt your baby move in over an hour. °· You have severe headaches that do not go away with medicine. °· You have vision changes. °  °This information is not intended to replace advice given to you by your health care provider. Make sure you discuss any questions you have with your health care provider. °  °Document Released: 12/26/2000 Document Revised: 01/22/2014 Document Reviewed: 03/04/2012 °Elsevier Interactive Patient Education ©2016 [employer] Inc[employer] ° °
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
## 2015-11-09 ENCOUNTER — Inpatient Hospital Stay (HOSPITAL_COMMUNITY)
Admission: AD | Admit: 2015-11-09 | Discharge: 2015-11-09 | Disposition: A | Discharge status: Home / Self Care | Payer: Medicaid Other | Source: Ambulatory Visit | Attending: Obstetrics and Gynecology | Admitting: Obstetrics and Gynecology

## 2015-11-09 ENCOUNTER — Inpatient Hospital Stay (HOSPITAL_COMMUNITY): Payer: Medicaid Other

## 2015-11-09 ENCOUNTER — Encounter (HOSPITAL_COMMUNITY): Payer: Self-pay | Admitting: *Deleted

## 2015-11-09 DIAGNOSIS — Z3A37 37 weeks gestation of pregnancy: Secondary | ICD-10-CM | POA: Diagnosis not present

## 2015-11-09 DIAGNOSIS — O4290 Premature rupture of membranes, unspecified as to length of time between rupture and onset of labor, unspecified weeks of gestation: Secondary | ICD-10-CM | POA: Diagnosis not present

## 2015-11-09 DIAGNOSIS — O99333 Smoking (tobacco) complicating pregnancy, third trimester: Secondary | ICD-10-CM | POA: Diagnosis not present

## 2015-11-09 NOTE — MAU Note (Signed)
Fern negative.

## 2015-11-09 NOTE — Discharge Instructions (Signed)
Braxton Hicks Contractions °Contractions of the uterus can occur throughout pregnancy. Contractions are not always a sign that you are in labor.  °WHAT ARE BRAXTON HICKS CONTRACTIONS?  °Contractions that occur before labor are called Braxton Hicks contractions, or false labor. Toward the end of pregnancy (32-34 weeks), these contractions can develop more often and may become more forceful. This is not true labor because these contractions do not result in opening (dilatation) and thinning of the cervix. They are sometimes difficult to tell apart from true labor because these contractions can be forceful and people have different pain tolerances. You should not feel embarrassed if you go to the hospital with false labor. Sometimes, the only way to tell if you are in true labor is for your health care provider to look for changes in the cervix. °If there are no prenatal problems or other health problems associated with the pregnancy, it is completely safe to be sent home with false labor and await the onset of true labor. °HOW CAN YOU TELL THE DIFFERENCE BETWEEN TRUE AND FALSE LABOR? °False Labor °· The contractions of false labor are usually shorter and not as hard as those of true labor.   °· The contractions are usually irregular.   °· The contractions are often felt in the front of the lower abdomen and in the groin.   °· The contractions may go away when you walk around or change positions while lying down.   °· The contractions get weaker and are shorter lasting as time goes on.   °· The contractions do not usually become progressively stronger, regular, and closer together as with true labor.   °True Labor °· Contractions in true labor last 30-70 seconds, become very regular, usually become more intense, and increase in frequency.   °· The contractions do not go away with walking.   °· The discomfort is usually felt in the top of the uterus and spreads to the lower abdomen and low back.   °· True labor can be  determined by your health care provider with an exam. This will show that the cervix is dilating and getting thinner.   °WHAT TO REMEMBER °· Keep up with your usual exercises and follow other instructions given by your health care provider.   °· Take medicines as directed by your health care provider.   °· Keep your regular prenatal appointments.   °· Eat and drink lightly if you think you are going into labor.   °· If Braxton Hicks contractions are making you uncomfortable:   °¨ Change your position from lying down or resting to walking, or from walking to resting.   °¨ Sit and rest in a tub of warm water.   °¨ Drink 2-3 glasses of water. Dehydration may cause these contractions.   °¨ Do slow and deep breathing several times an hour.   °WHEN SHOULD I SEEK IMMEDIATE MEDICAL CARE? °Seek immediate medical care if: °· Your contractions become stronger, more regular, and closer together.   °· You have fluid leaking or gushing from your vagina.   °· You have a fever.   °· You pass blood-tinged mucus.   °· You have vaginal bleeding.   °· You have continuous abdominal pain.   °· You have low back pain that you never had before.   °· You feel your baby's head pushing down and causing pelvic pressure.   °· Your baby is not moving as much as it used to.   °  °This information is not intended to replace advice given to you by your health care provider. Make sure you discuss any questions you have with your health care   provider. °  °Document Released: 01/01/2005 Document Revised: 01/06/2013 Document Reviewed: 10/13/2012 °Elsevier Interactive Patient Education ©2016 [employer] °Fetal Movement Counts °Patient Name: __________________________________________________ Patient Due Date: ____________________ °Performing a fetal movement count is highly recommended in high-risk pregnancies, but it is good for every pregnant woman to do. Your health care provider may ask you to start counting fetal movements at 28 weeks of the  pregnancy. Fetal movements often increase: °· After eating a full meal. °· After physical activity. °· After eating or drinking something sweet or cold. °· At rest. °Pay attention to when you feel the baby is most active. This will help you notice a pattern of your baby's sleep and wake cycles and what factors contribute to an increase in fetal movement. It is important to perform a fetal movement count at the same time each day when your baby is normally most active.  °HOW TO COUNT FETAL MOVEMENTS °1. Find a quiet and comfortable area to sit or lie down on your left side. Lying on your left side provides the best blood and oxygen circulation to your baby. °2. Write down the day and time on a sheet of paper or in a journal. °3. Start counting kicks, flutters, swishes, rolls, or jabs in a 2-hour period. You should feel at least 10 movements within 2 hours. °4. If you do not feel 10 movements in 2 hours, wait 2-3 hours and count again. Look for a change in the pattern or not enough counts in 2 hours. °SEEK MEDICAL CARE IF: °· You feel less than 10 counts in 2 hours, tried twice. °· There is no movement in over an hour. °· The pattern is changing or taking longer each day to reach 10 counts in 2 hours. °· You feel the baby is not moving as he or she usually does. °Date: ____________ Movements: ____________ Start time: ____________ Finish time: ____________  °Date: ____________ Movements: ____________ Start time: ____________ Finish time: ____________ °Date: ____________ Movements: ____________ Start time: ____________ Finish time: ____________ °Date: ____________ Movements: ____________ Start time: ____________ Finish time: ____________ °Date: ____________ Movements: ____________ Start time: ____________ Finish time: ____________ °Date: ____________ Movements: ____________ Start time: ____________ Finish time: ____________ °Date: ____________ Movements: ____________ Start time: ____________ Finish time:  ____________ °Date: ____________ Movements: ____________ Start time: ____________ Finish time: ____________  °Date: ____________ Movements: ____________ Start time: ____________ Finish time: ____________ °Date: ____________ Movements: ____________ Start time: ____________ Finish time: ____________ °Date: ____________ Movements: ____________ Start time: ____________ Finish time: ____________ °Date: ____________ Movements: ____________ Start time: ____________ Finish time: ____________ °Date: ____________ Movements: ____________ Start time: ____________ Finish time: ____________ °Date: ____________ Movements: ____________ Start time: ____________ Finish time: ____________ °Date: ____________ Movements: ____________ Start time: ____________ Finish time: ____________  °Date: ____________ Movements: ____________ Start time: ____________ Finish time: ____________ °Date: ____________ Movements: ____________ Start time: ____________ Finish time: ____________ °Date: ____________ Movements: ____________ Start time: ____________ Finish time: ____________ °Date: ____________ Movements: ____________ Start time: ____________ Finish time: ____________ °Date: ____________ Movements: ____________ Start time: ____________ Finish time: ____________ °Date: ____________ Movements: ____________ Start time: ____________ Finish time: ____________ °Date: ____________ Movements: ____________ Start time: ____________ Finish time: ____________  °Date: ____________ Movements: ____________ Start time: ____________ Finish time: ____________ °Date: ____________ Movements: ____________ Start time: ____________ Finish time: ____________ °Date: ____________ Movements: ____________ Start time: ____________ Finish time: ____________ °Date: ____________ Movements: ____________ Start time: ____________ Finish time: ____________ °Date: ____________ Movements: ____________ Start time: ____________ Finish time: ____________ °Date: ____________ Movements:  ____________ Start time: ____________ Finish time:   ____________ °Date: ____________ Movements: ____________ Start time: ____________ Finish time: ____________  °Date: ____________ Movements: ____________ Start time: ____________ Finish time: ____________ °Date: ____________ Movements: ____________ Start time: ____________ Finish time: ____________ °Date: ____________ Movements: ____________ Start time: ____________ Finish time: ____________ °Date: ____________ Movements: ____________ Start time: ____________ Finish time: ____________ °Date: ____________ Movements: ____________ Start time: ____________ Finish time: ____________ °Date: ____________ Movements: ____________ Start time: ____________ Finish time: ____________ °Date: ____________ Movements: ____________ Start time: ____________ Finish time: ____________  °Date: ____________ Movements: ____________ Start time: ____________ Finish time: ____________ °Date: ____________ Movements: ____________ Start time: ____________ Finish time: ____________ °Date: ____________ Movements: ____________ Start time: ____________ Finish time: ____________ °Date: ____________ Movements: ____________ Start time: ____________ Finish time: ____________ °Date: ____________ Movements: ____________ Start time: ____________ Finish time: ____________ °Date: ____________ Movements: ____________ Start time: ____________ Finish time: ____________ °Date: ____________ Movements: ____________ Start time: ____________ Finish time: ____________  °Date: ____________ Movements: ____________ Start time: ____________ Finish time: ____________ °Date: ____________ Movements: ____________ Start time: ____________ Finish time: ____________ °Date: ____________ Movements: ____________ Start time: ____________ Finish time: ____________ °Date: ____________ Movements: ____________ Start time: ____________ Finish time: ____________ °Date: ____________ Movements: ____________ Start time: ____________ Finish  time: ____________ °Date: ____________ Movements: ____________ Start time: ____________ Finish time: ____________ °Date: ____________ Movements: ____________ Start time: ____________ Finish time: ____________  °Date: ____________ Movements: ____________ Start time: ____________ Finish time: ____________ °Date: ____________ Movements: ____________ Start time: ____________ Finish time: ____________ °Date: ____________ Movements: ____________ Start time: ____________ Finish time: ____________ °Date: ____________ Movements: ____________ Start time: ____________ Finish time: ____________ °Date: ____________ Movements: ____________ Start time: ____________ Finish time: ____________ °Date: ____________ Movements: ____________ Start time: ____________ Finish time: ____________ °  °This information is not intended to replace advice given to you by your health care provider. Make sure you discuss any questions you have with your health care provider. °  °Document Released: 01/31/2006 Document Revised: 01/22/2014 Document Reviewed: 10/29/2011 °Elsevier Interactive Patient Education ©2016 [employer] ° °

## 2015-11-09 NOTE — MAU Note (Signed)
Pt states she started feeling clear fluid leaking yesterday morning. Pt states the baby is moving normally. Pt denies contractions and bleeding.

## 2015-11-11 LAB — CULTURE, BETA STREP (GROUP B ONLY)

## 2015-11-11 LAB — OB RESULTS CONSOLE GBS: GBS: NEGATIVE

## 2015-11-21 ENCOUNTER — Inpatient Hospital Stay (HOSPITAL_COMMUNITY)
Admission: AD | Admit: 2015-11-21 | Discharge: 2015-11-21 | Disposition: A | Discharge status: Home / Self Care | Payer: Medicaid Other | Source: Ambulatory Visit | Attending: Obstetrics and Gynecology | Admitting: Obstetrics and Gynecology

## 2015-11-21 ENCOUNTER — Encounter (HOSPITAL_COMMUNITY): Payer: Self-pay

## 2015-11-21 ENCOUNTER — Inpatient Hospital Stay (HOSPITAL_COMMUNITY)
Admission: AD | Admit: 2015-11-21 | Discharge: 2015-11-23 | DRG: 775 | Disposition: A | Discharge status: Home / Self Care | Payer: Medicaid Other | Source: Ambulatory Visit | Attending: Obstetrics and Gynecology | Admitting: Obstetrics and Gynecology

## 2015-11-21 ENCOUNTER — Inpatient Hospital Stay (HOSPITAL_COMMUNITY): Payer: Medicaid Other | Admitting: Anesthesiology

## 2015-11-21 DIAGNOSIS — D573 Sickle-cell trait: Secondary | ICD-10-CM | POA: Diagnosis present

## 2015-11-21 DIAGNOSIS — Z3A38 38 weeks gestation of pregnancy: Secondary | ICD-10-CM | POA: Diagnosis not present

## 2015-11-21 DIAGNOSIS — O99344 Other mental disorders complicating childbirth: Secondary | ICD-10-CM | POA: Diagnosis present

## 2015-11-21 DIAGNOSIS — O9902 Anemia complicating childbirth: Secondary | ICD-10-CM | POA: Diagnosis present

## 2015-11-21 DIAGNOSIS — R8271 Bacteriuria: Secondary | ICD-10-CM

## 2015-11-21 DIAGNOSIS — O99334 Smoking (tobacco) complicating childbirth: Secondary | ICD-10-CM | POA: Diagnosis present

## 2015-11-21 DIAGNOSIS — F418 Other specified anxiety disorders: Secondary | ICD-10-CM | POA: Diagnosis present

## 2015-11-21 DIAGNOSIS — F17210 Nicotine dependence, cigarettes, uncomplicated: Secondary | ICD-10-CM | POA: Diagnosis present

## 2015-11-21 DIAGNOSIS — Z8659 Personal history of other mental and behavioral disorders: Secondary | ICD-10-CM

## 2015-11-21 DIAGNOSIS — Z8619 Personal history of other infectious and parasitic diseases: Secondary | ICD-10-CM

## 2015-11-21 DIAGNOSIS — Z862 Personal history of diseases of the blood and blood-forming organs and certain disorders involving the immune mechanism: Secondary | ICD-10-CM

## 2015-11-21 DIAGNOSIS — Z9189 Other specified personal risk factors, not elsewhere classified: Secondary | ICD-10-CM

## 2015-11-21 DIAGNOSIS — Z3490 Encounter for supervision of normal pregnancy, unspecified, unspecified trimester: Secondary | ICD-10-CM

## 2015-11-21 DIAGNOSIS — Z3403 Encounter for supervision of normal first pregnancy, third trimester: Secondary | ICD-10-CM | POA: Diagnosis present

## 2015-11-21 LAB — TYPE AND SCREEN
ABO/RH(D): O POS
ANTIBODY SCREEN: NEGATIVE

## 2015-11-21 LAB — CBC
HCT: 33.5 % — ABNORMAL LOW (ref 36.0–46.0)
Hemoglobin: 11.7 g/dL — ABNORMAL LOW (ref 12.0–15.0)
MCH: 24.8 pg — AB (ref 26.0–34.0)
MCHC: 34.9 g/dL (ref 30.0–36.0)
MCV: 71 fL — ABNORMAL LOW (ref 78.0–100.0)
Platelets: 246 10*3/uL (ref 150–400)
RBC: 4.72 MIL/uL (ref 3.87–5.11)
RDW: 16.1 % — AB (ref 11.5–15.5)
WBC: 8.6 10*3/uL (ref 4.0–10.5)

## 2015-11-21 MED ORDER — LACTATED RINGERS IV SOLN
500.0000 mL | Freq: Once | INTRAVENOUS | Status: AC
  Administered 2015-11-21: 500 mL via INTRAVENOUS

## 2015-11-21 MED ORDER — OXYTOCIN BOLUS FROM INFUSION
500.0000 mL | Freq: Once | INTRAVENOUS | Status: AC
  Administered 2015-11-22: 500 mL via INTRAVENOUS

## 2015-11-21 MED ORDER — OXYTOCIN 40 UNITS IN LACTATED RINGERS INFUSION - SIMPLE MED
2.5000 [IU]/h | INTRAVENOUS | Status: DC
  Filled 2015-11-21: qty 1000

## 2015-11-21 MED ORDER — LACTATED RINGERS IV SOLN
500.0000 mL | INTRAVENOUS | Status: DC | PRN
  Administered 2015-11-22: 250 mL via INTRAVENOUS

## 2015-11-21 MED ORDER — DIPHENHYDRAMINE HCL 50 MG/ML IJ SOLN: 12.5000 mg | INTRAMUSCULAR | Status: DC | PRN

## 2015-11-21 MED ORDER — FENTANYL 2.5 MCG/ML BUPIVACAINE 1/10 % EPIDURAL INFUSION (WH - ANES)
14.0000 mL/h | INTRAMUSCULAR | Status: DC | PRN
  Administered 2015-11-21: 14 mL/h via EPIDURAL
  Filled 2015-11-21: qty 100

## 2015-11-21 MED ORDER — PENICILLIN G POTASSIUM 5000000 UNITS IJ SOLR
2.5000 10*6.[IU] | INTRAVENOUS | Status: DC
  Administered 2015-11-21: 2.5 10*6.[IU] via INTRAVENOUS
  Filled 2015-11-21 (×4): qty 2.5

## 2015-11-21 MED ORDER — ACETAMINOPHEN 325 MG PO TABS: 650.0000 mg | ORAL_TABLET | ORAL | Status: DC | PRN

## 2015-11-21 MED ORDER — ONDANSETRON HCL 4 MG/2ML IJ SOLN: 4.0000 mg | Freq: Four times a day (QID) | INTRAMUSCULAR | Status: DC | PRN

## 2015-11-21 MED ORDER — OXYCODONE-ACETAMINOPHEN 5-325 MG PO TABS: 1.0000 | ORAL_TABLET | ORAL | Status: DC | PRN

## 2015-11-21 MED ORDER — LIDOCAINE HCL (PF) 1 % IJ SOLN
30.0000 mL | INTRAMUSCULAR | Status: DC | PRN
  Filled 2015-11-21: qty 30

## 2015-11-21 MED ORDER — FLEET ENEMA 7-19 GM/118ML RE ENEM: 1.0000 | ENEMA | RECTAL | Status: DC | PRN

## 2015-11-21 MED ORDER — BUTORPHANOL TARTRATE 1 MG/ML IJ SOLN
1.0000 mg | INTRAMUSCULAR | Status: DC | PRN
  Administered 2015-11-21: 1 mg via INTRAVENOUS
  Filled 2015-11-21: qty 1

## 2015-11-21 MED ORDER — OXYCODONE-ACETAMINOPHEN 5-325 MG PO TABS: 2.0000 | ORAL_TABLET | ORAL | Status: DC | PRN

## 2015-11-21 MED ORDER — PENICILLIN G POTASSIUM 5000000 UNITS IJ SOLR
5.0000 10*6.[IU] | Freq: Once | INTRAVENOUS | Status: AC
  Administered 2015-11-21: 5 10*6.[IU] via INTRAVENOUS
  Filled 2015-11-21: qty 5

## 2015-11-21 MED ORDER — EPHEDRINE 5 MG/ML INJ
10.0000 mg | INTRAVENOUS | Status: DC | PRN
  Filled 2015-11-21: qty 4

## 2015-11-21 MED ORDER — LACTATED RINGERS IV SOLN
INTRAVENOUS | Status: DC
  Administered 2015-11-21: 125 mL/h via INTRAVENOUS
  Administered 2015-11-21: 23:00:00 via INTRAVENOUS

## 2015-11-21 MED ORDER — SOD CITRATE-CITRIC ACID 500-334 MG/5ML PO SOLN: 30.0000 mL | ORAL | Status: DC | PRN

## 2015-11-21 MED ORDER — PHENYLEPHRINE 40 MCG/ML (10ML) SYRINGE FOR IV PUSH (FOR BLOOD PRESSURE SUPPORT)
80.0000 ug | PREFILLED_SYRINGE | INTRAVENOUS | Status: DC | PRN
  Filled 2015-11-21: qty 5
  Filled 2015-11-21: qty 10

## 2015-11-21 MED ORDER — LIDOCAINE HCL (PF) 1 % IJ SOLN
INTRAMUSCULAR | Status: DC | PRN
  Administered 2015-11-21 (×2): 5 mL

## 2015-11-21 MED ORDER — PHENYLEPHRINE 40 MCG/ML (10ML) SYRINGE FOR IV PUSH (FOR BLOOD PRESSURE SUPPORT)
80.0000 ug | PREFILLED_SYRINGE | INTRAVENOUS | Status: DC | PRN
Start: 2015-11-21 — End: 2015-11-22
  Filled 2015-11-21: qty 10
  Filled 2015-11-21: qty 5

## 2015-11-21 NOTE — Anesthesia Procedure Notes (Signed)
Epidural Patient location during procedure: OB  Staffing Anesthesiologist: Mackenzie Santiago, Mackenzie Santiago: anesthesiologist   Preanesthetic Checklist Completed: patient identified, site marked, surgical consent, pre-op evaluation, timeout Santiago, IV checked, risks and benefits discussed and monitors and equipment checked  Epidural Patient position: sitting Prep: DuraPrep Patient monitoring: heart rate, continuous pulse ox and blood pressure Approach: midline Location: L3-L4 Injection technique: LOR saline  Needle:  Needle type: Tuohy  Needle gauge: 17 G Needle length: 9 cm and 9 Needle insertion depth: 5 cm Catheter type: closed end flexible Catheter size: 20 Guage Catheter at skin depth: 10 cm Test dose: negative  Assessment Events: blood not aspirated, injection not painful, no injection resistance, negative IV test and no paresthesia  Additional Notes Patient identified. Risks/Benefits/Options discussed with patient including but not limited to bleeding, infection, nerve damage, paralysis, failed block, incomplete pain control, headache, blood pressure changes, nausea, vomiting, reactions to medication both or allergic, itching and postpartum back pain. Confirmed with bedside nurse the patient's most recent platelet count. Confirmed with patient that they are not currently taking any anticoagulation, have any bleeding history or any family history of bleeding disorders. Patient expressed understanding and wished to proceed. All questions were answered. Sterile technique was used throughout the entire procedure. Please see nursing notes for vital signs. Test dose was given through epidural needle and negative prior to continuing to dose epidural or start infusion. Warning signs of high block given to the patient including shortness of breath, tingling/numbness in hands, complete motor block, or any concerning symptoms with instructions to call for help. Patient was given instructions  on fall risk and not to get out of bed. All questions and concerns addressed with instructions to call with any issues.  !st attempt resulted in intravacular catheter. immediatety recognized with heme asiration, Pulled out and redone a level higher

## 2015-11-21 NOTE — H&P (Signed)
Admission History and Physical Exam for an Obstetrics Patient  Mackenzie Santiago is a 20 y.o. female, G1P0, at 4035w3d gestation, who presents for evaluation of uterine contractions. [facility]  Her pregnancy has been complicated by cigarette smoking, anxiety, depression, and a difficult home situation. She has not been seen in the office for one month because she has been moving. She was seen earlier today. Her cervix was noted to be 2 cm dilated. She presents now saying that her contractions have gotten stronger. She was treated for chlamydia earlier in pregnancy. Her test of cure was negative. She has sickle cell C trait. The father of the baby is negative. The patient had 1000 colonies of group B strep in her urine at her new OB visit. Her beta strep culture at 37 weeks was negative. See history below.          OB History    Gravida Para Term Preterm AB Living   1         0   SAB TAB Ectopic Multiple Live Births                      Past Medical History:  Diagnosis Date  . Heart murmur   . Thyroid disease   . Trichimoniasis            Prescriptions Prior to Admission  Medication Sig Dispense Refill Last Dose  . Magnesium 250 MG TABS Take 1 tablet by mouth daily.  0 11/08/2015 at Unknown time  . Prenatal Vit-Fe Fumarate-FA (PRENATAL MULTIVITAMIN) TABS tablet Take 1 tablet by mouth daily at 12 noon.   Past Week at Unknown time         Past Surgical History:  Procedure Laterality Date  . NO PAST SURGERIES           Allergies  Allergen Reactions  . Blueberry [Vaccinium Angustifolium]     Hives     Family History: family history includes Sickle cell anemia in her father, maternal grandfather, and paternal grandfather; Thyroid disease in her paternal grandmother.  Social History:  reports that she has been smoking Cigarettes.  She has been  smoking about 0.25 packs per day. She uses smokeless tobacco. She reports that she does not drink alcohol or use drugs.  Review of systems: Normal pregnancy complaints.  Admission Physical Exam:  Cervix is 5 cm dilated  BP (!) 114/59 (BP Location: Right Arm)   Pulse 80   Temp 98.5 F (36.9 C) (Oral)   Resp 20   Ht 5\' 1"  (1.549 m)   Wt 127 lb (57.6 kg)   LMP 02/18/2015   BMI 24.00 kg/m   HEENT:                 Within normal limits Chest:                   Clear Heart:                    Regular rate and rhythm Abdomen:             Gravid and nontender  NST: Category 1; Contractions: Strong    Assessment:  1735w3d gestation  Active labor  Anxiety and depression  Cigarette smoker  1000 colonies of group B strep in her urine at her new OB visit followed by a negative beta strep  culture at [redacted] weeks gestation  Sickle cell C trait  Prenatal labs: ABO, Rh:        O+       HBsAg:            Nonreactive        HIV:                 Nonreactive         GBS:                Negative beta strep culture at [redacted] weeks gestation but 1000 colonies of beta strep in her urine at her new OB visit       Antibody:           Negative     Rubella:              Immune RPR:                   Nonreactive        Prenatal Transfer Tool  Maternal Diabetes: No Genetic Screening: Normal Maternal Ultrasounds/Referrals: Normal Fetal Ultrasounds or other Referrals:  None Maternal Substance Abuse:  Cigarettes Significant Maternal Medications:  None Significant Maternal Lab Results:  See beta strep results above Other Comments:  The patient received her T depth vaccination  Plan:  Admit for delivery  I will treat the patient as if she were beta strep positive.  I anticipate a vaginal delivery   Kamillah Santiago V 11/21/2015, 6:25 PM

## 2015-11-21 NOTE — Discharge Instructions (Signed)
Braxton Hicks Contractions °Contractions of the uterus can occur throughout pregnancy. Contractions are not always a sign that you are in labor.  °WHAT ARE BRAXTON HICKS CONTRACTIONS?  °Contractions that occur before labor are called Braxton Hicks contractions, or false labor. Toward the end of pregnancy (32-34 weeks), these contractions can develop more often and may become more forceful. This is not true labor because these contractions do not result in opening (dilatation) and thinning of the cervix. They are sometimes difficult to tell apart from true labor because these contractions can be forceful and people have different pain tolerances. You should not feel embarrassed if you go to the hospital with false labor. Sometimes, the only way to tell if you are in true labor is for your health care provider to look for changes in the cervix. °If there are no prenatal problems or other health problems associated with the pregnancy, it is completely safe to be sent home with false labor and await the onset of true labor. °HOW CAN YOU TELL THE DIFFERENCE BETWEEN TRUE AND FALSE LABOR? °False Labor °· The contractions of false labor are usually shorter and not as hard as those of true labor.   °· The contractions are usually irregular.   °· The contractions are often felt in the front of the lower abdomen and in the groin.   °· The contractions may go away when you walk around or change positions while lying down.   °· The contractions get weaker and are shorter lasting as time goes on.   °· The contractions do not usually become progressively stronger, regular, and closer together as with true labor.   °True Labor °· Contractions in true labor last 30-70 seconds, become very regular, usually become more intense, and increase in frequency.   °· The contractions do not go away with walking.   °· The discomfort is usually felt in the top of the uterus and spreads to the lower abdomen and low back.   °· True labor can be  determined by your health care provider with an exam. This will show that the cervix is dilating and getting thinner.   °WHAT TO REMEMBER °· Keep up with your usual exercises and follow other instructions given by your health care provider.   °· Take medicines as directed by your health care provider.   °· Keep your regular prenatal appointments.   °· Eat and drink lightly if you think you are going into labor.   °· If Braxton Hicks contractions are making you uncomfortable:   °¨ Change your position from lying down or resting to walking, or from walking to resting.   °¨ Sit and rest in a tub of warm water.   °¨ Drink 2-3 glasses of water. Dehydration may cause these contractions.   °¨ Do slow and deep breathing several times an hour.   °WHEN SHOULD I SEEK IMMEDIATE MEDICAL CARE? °Seek immediate medical care if: °· Your contractions become stronger, more regular, and closer together.   °· You have fluid leaking or gushing from your vagina.   °· You have a fever.   °· You pass blood-tinged mucus.   °· You have vaginal bleeding.   °· You have continuous abdominal pain.   °· You have low back pain that you never had before.   °· You feel your baby's head pushing down and causing pelvic pressure.   °· Your baby is not moving as much as it used to.   °  °This information is not intended to replace advice given to you by your health care provider. Make sure you discuss any questions you have with your health care   provider. °  °Document Released: 01/01/2005 Document Revised: 01/06/2013 Document Reviewed: 10/13/2012 °Elsevier Interactive Patient Education ©2016 [employer] ° °

## 2015-11-21 NOTE — MAU Note (Signed)
Pt having ctx, Had some spotting earlier today and thought she lost her mucous plug.

## 2015-11-21 NOTE — MAU Provider Note (Signed)
DATE: 11/21/2015  Maternity Admissions Unit History and Physical Exam for an Obstetrics Patient  [name] is a 20 y.o. female, G1P0, at 576w3d gestation, who presents for evaluation of uterine contractions. [facility].  Her pregnancy has been complicated by cigarette smoking, anxiety, depression, and a difficult home situation. She has not been seen in the office for one month because she has been moving. See history below.  OB History    Gravida Para Term Preterm AB Living   1         0   SAB TAB Ectopic Multiple Live Births                  Past Medical History:  Diagnosis Date  . Heart murmur   . Thyroid disease   . Trichimoniasis     Prescriptions Prior to Admission  Medication Sig Dispense Refill Last Dose  . Magnesium 250 MG TABS Take 1 tablet by mouth daily.  0 11/08/2015 at Unknown time  . Prenatal Vit-Fe Fumarate-FA (PRENATAL MULTIVITAMIN) TABS tablet Take 1 tablet by mouth daily at 12 noon.   Past Week at Unknown time    Past Surgical History:  Procedure Laterality Date  . NO PAST SURGERIES      Allergies  Allergen Reactions  . Blueberry [Vaccinium Angustifolium]     Hives      Family History: family history includes Sickle cell anemia in her father, maternal grandfather, and paternal grandfather; Thyroid disease in her paternal grandmother.  Social History:  reports that she has been smoking Cigarettes.  She has been smoking about 0.25 packs per day. She uses smokeless tobacco. She reports that she does not drink alcohol or use drugs.  Review of systems: Normal pregnancy complaints.  Admission Physical Exam:  Dilation: 2.5 Station: -2 Exam by:: Pilar Westergaard MD There is no height or weight on file to calculate BMI.  Blood pressure 121/85, pulse 102, temperature 97.7 F (36.5 C), resp. rate 18, last menstrual period 02/18/2015.  HEENT:                  Within normal limits Chest:                   Clear Heart:                    Regular rate and rhythm Abdomen:             Gravid and nontender  NST: Category 1; Contractions: Mild to moderate .   Assessment:  506w3d gestation  Uterine contractions  Cervix unchanged from examination last week  History of noncompliance  Plan:  Discharged from evaluation unit. Labor discussed. The patient was told to return for any questions or concerns. The patient was asked to make an appointment later this week in the office.   Rhylynn Perdomo V 11/21/2015, 3:05 PM

## 2015-11-21 NOTE — Anesthesia Preprocedure Evaluation (Signed)
Anesthesia Evaluation  Patient identified by MRN, date of birth, ID band Patient awake    Reviewed: Allergy & Precautions, H&P , NPO status , Patient's Chart, lab work & pertinent test results  History of Anesthesia Complications Negative for: history of anesthetic complications  Airway Mallampati: II  TM Distance: >3 FB Neck ROM: full    Dental no notable dental hx. (+) Teeth Intact   Pulmonary neg pulmonary ROS, Current Smoker,    Pulmonary exam normal breath sounds clear to auscultation       Cardiovascular negative cardio ROS Normal cardiovascular exam Rhythm:regular Rate:Normal     Neuro/Psych negative neurological ROS  negative psych ROS   GI/Hepatic negative GI ROS, Neg liver ROS,   Endo/Other  negative endocrine ROS  Renal/GU negative Renal ROS  negative genitourinary   Musculoskeletal negative musculoskeletal ROS (+)   Abdominal   Peds negative pediatric ROS (+)  Hematology negative hematology ROS (+)   Anesthesia Other Findings   Reproductive/Obstetrics (+) Pregnancy                             Anesthesia Physical Anesthesia Plan  ASA: II  Anesthesia Plan: Epidural   Post-op Pain Management:    Induction:   Airway Management Planned:   Additional Equipment:   Intra-op Plan:   Post-operative Plan:   Informed Consent: I have reviewed the patients History and Physical, chart, labs and discussed the procedure including the risks, benefits and alternatives for the proposed anesthesia with the patient or authorized representative who has indicated his/her understanding and acceptance.     Plan Discussed with:   Anesthesia Plan Comments:         Anesthesia Quick Evaluation

## 2015-11-22 ENCOUNTER — Encounter (HOSPITAL_COMMUNITY): Payer: Self-pay

## 2015-11-22 DIAGNOSIS — Z9189 Other specified personal risk factors, not elsewhere classified: Secondary | ICD-10-CM

## 2015-11-22 DIAGNOSIS — F17210 Nicotine dependence, cigarettes, uncomplicated: Secondary | ICD-10-CM

## 2015-11-22 DIAGNOSIS — R8271 Bacteriuria: Secondary | ICD-10-CM

## 2015-11-22 DIAGNOSIS — Z8659 Personal history of other mental and behavioral disorders: Secondary | ICD-10-CM

## 2015-11-22 DIAGNOSIS — Z862 Personal history of diseases of the blood and blood-forming organs and certain disorders involving the immune mechanism: Secondary | ICD-10-CM

## 2015-11-22 DIAGNOSIS — Z8619 Personal history of other infectious and parasitic diseases: Secondary | ICD-10-CM

## 2015-11-22 LAB — RPR: RPR: NONREACTIVE

## 2015-11-22 LAB — GC/CHLAMYDIA PROBE AMP (~~LOC~~) NOT AT ARMC
Chlamydia: NEGATIVE
Neisseria Gonorrhea: NEGATIVE

## 2015-11-22 LAB — ABO/RH: ABO/RH(D): O POS

## 2015-11-22 MED ORDER — SENNOSIDES-DOCUSATE SODIUM 8.6-50 MG PO TABS
2.0000 | ORAL_TABLET | ORAL | Status: DC
  Administered 2015-11-23: 2 via ORAL
  Filled 2015-11-22: qty 2

## 2015-11-22 MED ORDER — IBUPROFEN 600 MG PO TABS
600.0000 mg | ORAL_TABLET | Freq: Four times a day (QID) | ORAL | Status: DC
  Administered 2015-11-22 – 2015-11-23 (×7): 600 mg via ORAL
  Filled 2015-11-22 (×7): qty 1

## 2015-11-22 MED ORDER — DIPHENHYDRAMINE HCL 25 MG PO CAPS: 25.0000 mg | ORAL_CAPSULE | Freq: Four times a day (QID) | ORAL | Status: DC | PRN

## 2015-11-22 MED ORDER — OXYCODONE-ACETAMINOPHEN 5-325 MG PO TABS: 1.0000 | ORAL_TABLET | ORAL | Status: DC | PRN

## 2015-11-22 MED ORDER — DIBUCAINE 1 % RE OINT: 1.0000 "application " | TOPICAL_OINTMENT | RECTAL | Status: DC | PRN

## 2015-11-22 MED ORDER — ACETAMINOPHEN 325 MG PO TABS
650.0000 mg | ORAL_TABLET | ORAL | Status: DC | PRN
  Administered 2015-11-22 – 2015-11-23 (×3): 650 mg via ORAL
  Filled 2015-11-22 (×3): qty 2

## 2015-11-22 MED ORDER — TETANUS-DIPHTH-ACELL PERTUSSIS 5-2.5-18.5 LF-MCG/0.5 IM SUSP: 0.5000 mL | Freq: Once | INTRAMUSCULAR | Status: DC

## 2015-11-22 MED ORDER — WITCH HAZEL-GLYCERIN EX PADS: 1.0000 "application " | MEDICATED_PAD | CUTANEOUS | Status: DC | PRN

## 2015-11-22 MED ORDER — FERROUS SULFATE 325 (65 FE) MG PO TABS
325.0000 mg | ORAL_TABLET | Freq: Two times a day (BID) | ORAL | Status: DC
  Administered 2015-11-22 – 2015-11-23 (×4): 325 mg via ORAL
  Filled 2015-11-22 (×4): qty 1

## 2015-11-22 MED ORDER — OXYCODONE-ACETAMINOPHEN 5-325 MG PO TABS: 2.0000 | ORAL_TABLET | ORAL | Status: DC | PRN

## 2015-11-22 MED ORDER — MEDROXYPROGESTERONE ACETATE 150 MG/ML IM SUSP: 150.0000 mg | INTRAMUSCULAR | Status: DC | PRN

## 2015-11-22 MED ORDER — DOCUSATE SODIUM 100 MG PO CAPS
100.0000 mg | ORAL_CAPSULE | Freq: Two times a day (BID) | ORAL | Status: DC
  Administered 2015-11-23 (×2): 100 mg via ORAL
  Filled 2015-11-22 (×2): qty 1

## 2015-11-22 MED ORDER — ONDANSETRON HCL 4 MG/2ML IJ SOLN: 4.0000 mg | INTRAMUSCULAR | Status: DC | PRN

## 2015-11-22 MED ORDER — SIMETHICONE 80 MG PO CHEW: 80.0000 mg | CHEWABLE_TABLET | ORAL | Status: DC | PRN

## 2015-11-22 MED ORDER — BENZOCAINE-MENTHOL 20-0.5 % EX AERO
1.0000 "application " | INHALATION_SPRAY | CUTANEOUS | Status: DC | PRN
  Administered 2015-11-22: 1 via TOPICAL
  Filled 2015-11-22: qty 56

## 2015-11-22 MED ORDER — PRENATAL MULTIVITAMIN CH
1.0000 | ORAL_TABLET | Freq: Every day | ORAL | Status: DC
  Administered 2015-11-22 – 2015-11-23 (×2): 1 via ORAL
  Filled 2015-11-22 (×2): qty 1

## 2015-11-22 MED ORDER — COCONUT OIL OIL
1.0000 "application " | TOPICAL_OIL | Status: DC | PRN
  Administered 2015-11-23: 1 via TOPICAL
  Filled 2015-11-22: qty 120

## 2015-11-22 MED ORDER — ONDANSETRON HCL 4 MG PO TABS: 4.0000 mg | ORAL_TABLET | ORAL | Status: DC | PRN

## 2015-11-22 MED ORDER — ZOLPIDEM TARTRATE 5 MG PO TABS: 5.0000 mg | ORAL_TABLET | Freq: Every evening | ORAL | Status: DC | PRN

## 2015-11-22 NOTE — Plan of Care (Signed)
Problem: Nutritional: Goal: Mothers verbalization of comfort with breastfeeding process will improve Outcome: Completed/Met Date Met: 11/22/15 Encouraged patient to call for breast feeding assistance and latch scoring. Encouraged limiting formula and educated her on risks of giving formula.

## 2015-11-22 NOTE — Anesthesia Postprocedure Evaluation (Signed)
Anesthesia Post Note  Patient: Mackenzie Santiago  Procedure(s) Performed: * No procedures listed *  Patient location during evaluation: Mother Baby Anesthesia Type: Epidural Level of consciousness: awake and alert, oriented and patient cooperative Pain management: pain level controlled Vital Signs Assessment: post-procedure vital signs reviewed and stable Respiratory status: spontaneous breathing, nonlabored ventilation and respiratory function stable Cardiovascular status: stable Postop Assessment: no headache, no backache, patient able to bend at knees, no signs of nausea or vomiting and adequate PO intake     Last Vitals:  Vitals:   11/22/15 0308 11/22/15 0422  BP: 125/87 107/76  Pulse: 61 84  Resp: 18 20  Temp: 37 C 36.7 C    Last Pain: 0  Pain Goal:  4               Matthew Pais

## 2015-11-22 NOTE — Progress Notes (Signed)
UR chart review completed.  

## 2015-11-22 NOTE — Lactation Note (Signed)
This note was copied from a baby's chart. Lactation Consultation Note Follow up attempt at 20 hours of age.  FOB in bed with baby and several bottles noted at bedside.  Visitor reports mom is in the bathroom.  LC to follow tomorrow as needed.   Patient Name: Mackenzie Santiago     Maternal Data    Feeding Feeding Type: Breast Fed  LATCH Score/Interventions Latch: Grasps breast easily, tongue down, lips flanged, rhythmical sucking.  Audible Swallowing: A few with stimulation Intervention(s): Skin to skin;Hand expression  Type of Nipple: Everted at rest and after stimulation  Comfort (Breast/Nipple): Filling, red/small blisters or bruises, mild/mod discomfort  Problem noted: Mild/Moderate discomfort Interventions (Mild/moderate discomfort): Hand expression  Hold (Positioning): Assistance needed to correctly position infant at breast and maintain latch.  LATCH Score: 7  Lactation Tools Discussed/Used     Consult Status      Mackenzie Santiago, Mackenzie Santiago Santiago, 9:41 PM

## 2015-11-22 NOTE — Lactation Note (Signed)
This note was copied from a baby's chart. Lactation Consultation Note: Lactation Brochure given to first time mother. Infant is cueing and ready for feeding. Infant placed to the breast in football hold. Infant suckled with good pattern. Infant sustained latch for 10 mins on each breast. Advised mother to breastfeed 8-12 times in 24 hours. Reviewed baby and me book along with basic teaching, cue chart reviewed. Advised mother to call for assistance if needed. Mother is aware of available lactation services.  Patient Name: Mackenzie Santiago RUEAV'WToday's Date: 11/22/2015 Reason for consult: Initial assessment   Maternal Data    Feeding Feeding Type: Breast Fed Length of feed: 10 min  LATCH Score/Interventions Latch: Grasps breast easily, tongue down, lips flanged, rhythmical sucking.  Audible Swallowing: A few with stimulation  Type of Nipple: Everted at rest and after stimulation  Comfort (Breast/Nipple): Soft / non-tender     Hold (Positioning): Assistance needed to correctly position infant at breast and maintain latch. Intervention(s): Breastfeeding basics reviewed;Support Pillows;Position options;Skin to skin  LATCH Score: 8  Lactation Tools Discussed/Used     Consult Status Consult Status: Follow-up Date: 11/23/15 Follow-up type: In-patient    Stevan BornKendrick, [place] Mignogna [facility] 11/22/2015, 10:57 AM

## 2015-11-23 LAB — CBC
HCT: 27.4 % — ABNORMAL LOW (ref 36.0–46.0)
Hemoglobin: 9.5 g/dL — ABNORMAL LOW (ref 12.0–15.0)
MCH: 24.5 pg — ABNORMAL LOW (ref 26.0–34.0)
MCHC: 34.7 g/dL (ref 30.0–36.0)
MCV: 70.6 fL — ABNORMAL LOW (ref 78.0–100.0)
Platelets: 193 10*3/uL (ref 150–400)
RBC: 3.88 MIL/uL (ref 3.87–5.11)
RDW: 16.4 % — AB (ref 11.5–15.5)
WBC: 8.9 10*3/uL (ref 4.0–10.5)

## 2015-11-23 MED ORDER — MEDROXYPROGESTERONE ACETATE 150 MG/ML IM SUSP
150.0000 mg | Freq: Once | INTRAMUSCULAR | Status: AC
  Administered 2015-11-23: 150 mg via INTRAMUSCULAR
  Filled 2015-11-23: qty 1

## 2015-11-23 MED ORDER — IBUPROFEN 600 MG PO TABS: 600.0000 mg | ORAL_TABLET | Freq: Four times a day (QID) | ORAL | 0 refills | Status: DC | PRN

## 2015-11-23 NOTE — Lactation Note (Signed)
This note was copied from a baby's chart. Lactation Consultation Note  Patient Name: Mackenzie Santiago ZOXWR'UToday's Date: 11/23/2015 Reason for consult: Follow-up assessment Follow up visit made to assist with feeding.  Assisted with positioning baby skin to skin in cross cradle hold.  Instructed on compressing breast tissue for an easier and deeper latch.  Baby latched very well and nursed actively with audible swallows.  Mom is asking questions about volume baby is getting.  Discussed colostrum and milk coming to volume.  Mom has been also supplementing with formula because she is worried baby is not getting enough.  I explained to mom that we are monitoring baby's output and weight.  Discouraged formula use at this time.  Encouraged to nurse frequently with cues using good breast massage during feeding.  Instructed to call for assist/concerns prn.  Maternal Data    Feeding Feeding Type: Breast Fed  LATCH Score/Interventions Latch: Grasps breast easily, tongue down, lips flanged, rhythmical sucking.  Audible Swallowing: Spontaneous and intermittent Intervention(s): Skin to skin;Hand expression;Alternate breast massage  Type of Nipple: Everted at rest and after stimulation  Comfort (Breast/Nipple): Filling, red/small blisters or bruises, mild/mod discomfort  Problem noted: Mild/Moderate discomfort  Hold (Positioning): Assistance needed to correctly position infant at breast and maintain latch. Intervention(s): Breastfeeding basics reviewed;Support Pillows;Position options;Skin to skin  LATCH Score: 8  Lactation Tools Discussed/Used     Consult Status Consult Status: Follow-up Date: 11/24/15 Follow-up type: In-patient    Huston FoleyMOULDEN, Leonette Tischer S 11/23/2015, 12:25 PM

## 2015-11-23 NOTE — Discharge Summary (Signed)
Obstetric Discharge Summary Reason for Admission: onset of labor Prenatal Procedures: ultrasound Intrapartum Procedures: spontaneous vaginal delivery Postpartum Procedures: none General: alert and no distress Lochia:appropriate Uterine Fundus: firm and NT Incision: n/a DVT Evaluation: no evidence of DVT on exam  Discharge Diagnoses: Term Pregnancy-delivered  Discharge Information: Date: 11/23/2015 Activity: pelvic rest Diet: routine Medications: Ibuprofen Condition: stable Instructions: refer to practice specific booklet Discharge to: home Follow-up Information    [facility] up in 6 week(s).   Specialty:  Obstetrics and Gynecology Why:  schedule postpartum appointment Contact information: [address] [place] [phone]          Newborn Data: Live born female  Birth Weight: 6 lb 6.5 oz (2905 g) APGAR: 8, 9  Home with mother.  Mackenzie Santiago 11/23/2015, 5:16 PM

## 2015-11-23 NOTE — Progress Notes (Signed)
Post Partum Day 1  Subjective: no complaints, up ad lib, voiding, tolerating PO, + flatus and +BM.  Desires Depo prior to discharge.  Plans out patient circ.  Objective: Blood pressure 113/73, pulse 82, temperature 98.3 F (36.8 C), temperature source Oral, resp. rate 16, height 5\' 1"  (1.549 m), weight 127 lb (57.6 kg), last menstrual period 02/18/2015, unknown if currently breastfeeding.  Physical Exam:  General: alert and no distress Lochia: appropriate Uterine Fundus: firm, NT Incision: n/a DVT Evaluation: No evidence of DVT seen on physical exam.   Recent Labs  11/21/15 1800 11/23/15 0505  HGB 11.7* 9.5*  HCT 33.5* 27.4*    Assessment/Plan: Plan for discharge tomorrow - Doing well Lactation consult - pt concerned about inadequate let down Cont PP care   LOS: 2 days   Vic Esco Y 11/23/2015, 11:10 AM
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
## 2016-02-17 ENCOUNTER — Encounter (HOSPITAL_COMMUNITY): Payer: Self-pay | Admitting: *Deleted

## 2016-02-17 ENCOUNTER — Emergency Department (HOSPITAL_COMMUNITY)
Admission: EM | Admit: 2016-02-17 | Discharge: 2016-02-18 | Disposition: A | Discharge status: Home / Self Care | Payer: Medicaid Other | Attending: Emergency Medicine | Admitting: Emergency Medicine

## 2016-02-17 DIAGNOSIS — N760 Acute vaginitis: Secondary | ICD-10-CM | POA: Insufficient documentation

## 2016-02-17 DIAGNOSIS — Z79899 Other long term (current) drug therapy: Secondary | ICD-10-CM | POA: Insufficient documentation

## 2016-02-17 DIAGNOSIS — F17210 Nicotine dependence, cigarettes, uncomplicated: Secondary | ICD-10-CM | POA: Diagnosis not present

## 2016-02-17 DIAGNOSIS — N898 Other specified noninflammatory disorders of vagina: Secondary | ICD-10-CM | POA: Diagnosis present

## 2016-02-17 DIAGNOSIS — R0781 Pleurodynia: Secondary | ICD-10-CM | POA: Diagnosis not present

## 2016-02-17 LAB — WET PREP, GENITAL
CLUE CELLS WET PREP: NONE SEEN
Sperm: NONE SEEN
TRICH WET PREP: NONE SEEN
Yeast Wet Prep HPF POC: NONE SEEN

## 2016-02-17 LAB — I-STAT CHEM 8, ED
BUN: 8 mg/dL (ref 6–20)
CALCIUM ION: 1.19 mmol/L (ref 1.15–1.40)
CHLORIDE: 105 mmol/L (ref 101–111)
CREATININE: 0.7 mg/dL (ref 0.44–1.00)
GLUCOSE: 82 mg/dL (ref 65–99)
HCT: 39 % (ref 36.0–46.0)
Hemoglobin: 13.3 g/dL (ref 12.0–15.0)
Potassium: 3.5 mmol/L (ref 3.5–5.1)
Sodium: 140 mmol/L (ref 135–145)
TCO2: 24 mmol/L (ref 0–100)

## 2016-02-17 LAB — I-STAT BETA HCG BLOOD, ED (MC, WL, AP ONLY): I-stat hCG, quantitative: <5 m[IU]/mL (ref <5)

## 2016-02-17 LAB — D-DIMER, QUANTITATIVE: D-Dimer, Quant: <0.27 ug/mL-FEU (ref 0.00–0.50)

## 2016-02-17 NOTE — ED Provider Notes (Signed)
[facility]-EMERGENCY DEPT Provider Note   CSN: [id number] Arrival date & time: 02/17/16  1841    History   Chief Complaint Chief Complaint  Patient presents with  . Vaginal Bleeding    HPI Mackenzie Santiago is a 21 y.o. female.  21 year old female presents to the emergency department for evaluation of vaginal discharge. She states that she has had vaginal discharge over the past 6 weeks, since engaging in intercourse 3 weeks after a vaginal delivery. She states that the discharge is brown in color. She has had a small amount every day and denies the passage of bright red blood or clots. She has not had any purulent discharge from her vaginal canal. She states that her vaginal delivery was uncomplicated, but she did require stitches post delivery. The patient denies any burning dysuria or hematuria. No abdominal pain or shortness of breath, or fevers.   And also concerned about anorexia and weight loss. She states that she has had a lack of appetite and that she has felt fatigued. She states that she was breast-feeding up until 3 weeks ago when she stopped producing milk. She has not had any syncope, lightheadedness, or sick contacts. She further complains of a pain under her left breast. This is present with deep breathing and coughing. She states that the pain is sharp and sporadic. She has taken Tylenol for symptoms without relief. No hemoptysis or leg swelling.   The history is provided by the patient. No language interpreter was used.  Vaginal Bleeding  Primary symptoms include vaginal bleeding.    Past Medical History:  Diagnosis Date  . Heart murmur   . Thyroid disease   . Trichimoniasis     Patient Active Problem List   Diagnosis Date Noted  . Obstetric labial laceration, delivered, current hospitalization 11/23/2015  . SVD (spontaneous vaginal delivery) 11/22/2015  . H/O anxiety state 11/22/2015  . Cigarette smoker 11/22/2015  . H/O sickle cell trait (FOB neg) 11/22/2015  .  H/O chlamydia infection (neg TOC this pregnancy) 11/22/2015  . Group B streptococcal bacteriuria 11/22/2015  . H/O: depression 11/21/2015    Past Surgical History:  Procedure Laterality Date  . NO PAST SURGERIES      OB History    Gravida Para Term Preterm AB Living   1 1 1     1    SAB TAB Ectopic Multiple Live Births         0 1       Home Medications    Prior to Admission medications   Medication Sig Start Date End Date Taking? Authorizing Provider  ibuprofen (ADVIL,MOTRIN) 600 MG tablet Take 1 tablet (600 mg total) by mouth every 6 (six) hours as needed for mild pain, moderate pain or cramping. 11/23/15  Yes Osborn CohoAngela Roberts, MD  Prenatal Vit-Fe Fumarate-FA (PRENATAL MULTIVITAMIN) TABS tablet Take 1 tablet by mouth daily at 12 noon.   Yes Historical Provider, MD    Family History Family History  Problem Relation Age of Onset  . Sickle cell anemia Father   . Sickle cell anemia Maternal Grandfather   . Thyroid disease Paternal Grandmother   . Sickle cell anemia Paternal Grandfather   . Alcohol abuse Neg Hx   . Arthritis Neg Hx   . Asthma Neg Hx   . Birth defects Neg Hx   . Cancer Neg Hx   . COPD Neg Hx   . Depression Neg Hx   . Diabetes Neg Hx   . Drug abuse Neg Hx   .  Early death Neg Hx   . Hearing loss Neg Hx   . Heart disease Neg Hx   . Hyperlipidemia Neg Hx   . Hypertension Neg Hx   . Kidney disease Neg Hx   . Learning disabilities Neg Hx   . Mental illness Neg Hx   . Mental retardation Neg Hx   . Miscarriages / Stillbirths Neg Hx   . Stroke Neg Hx   . Vision loss Neg Hx   . Varicose Veins Neg Hx     Social History Social History  Substance Use Topics  . Smoking status: Current Every Day Smoker    Packs/day: 0.25    Types: Cigarettes  . Smokeless tobacco: Current User  . Alcohol use No     Comment: social     Allergies   Blueberry [vaccinium angustifolium]   Review of Systems Review of Systems  Genitourinary: Positive for vaginal bleeding.   Ten systems reviewed and are negative for acute change, except as noted in the HPI.    Physical Exam Updated Vital Signs BP 119/59 (BP Location: Left Arm)   Pulse 103   Temp 99.1 F (37.3 C) (Oral)   Resp 17   SpO2 98%    11:46 PM SpO2 99% on room air; HR 87bpm  Physical Exam  Constitutional: She is oriented to person, place, and time. She appears well-developed and well-nourished. No distress.  Nontoxic and in no distress  HENT:  Head: Normocephalic and atraumatic.  Eyes: Conjunctivae and EOM are normal. No scleral icterus.  Neck: Normal range of motion.  Cardiovascular: Regular rhythm and intact distal pulses.   Borderline tachycardia. Heart rate ranging from 92 bpm to 103 bpm.  Pulmonary/Chest: Effort normal. No respiratory distress. She has no wheezes.  No tachypnea or dyspnea. Respirations even and unlabored.  Abdominal: Soft. She exhibits no distension and no mass. There is no tenderness. There is no guarding.  Soft, nontender abdomen. No distention or rigidity.  Genitourinary:  Genitourinary Comments: Scant blood in vaginal vault. No CMT or friability. No adnexal TTP. No vaginal wall laceration visualized.  Musculoskeletal: Normal range of motion.  Neurological: She is alert and oriented to person, place, and time. She exhibits normal muscle tone. Coordination normal.  Skin: Skin is warm and dry. No rash noted. She is not diaphoretic. No erythema. No pallor.  Psychiatric: She has a normal mood and affect. Her behavior is normal.  Nursing note and vitals reviewed.    ED Treatments / Results  Labs (all labs ordered are listed, but only abnormal results are displayed) Labs Reviewed  WET PREP, GENITAL - Abnormal; Notable for the following:       Result Value   WBC, Wet Prep HPF POC MANY (*)    All other components within normal limits  D-DIMER, QUANTITATIVE (NOT AT [facility])  I-STAT CHEM 8, ED  I-STAT BETA HCG BLOOD, ED ([facility], [facility], [facility] ONLY)  GC/CHLAMYDIA PROBE AMP ([facility]) NOT AT [facility]    EKG  EKG Interpretation None       Radiology No results found.  Procedures Procedures (including critical care time)  Medications Ordered in ED Medications - No data to display   Initial Impression / Assessment and Plan / ED Course  I have reviewed the triage vital signs and the nursing notes.  Pertinent labs & imaging results that were available during my care of the patient were reviewed by me and considered in my medical decision making (see chart for details).  21 year old female just to the ED for multiple complaints. She is primarily concerned about vaginal spotting that has persisted since intercourse 3 weeks following SVD. Patient with a soft, nontender abdomen. Scant amount of blood appreciated in the vaginal vault. No evidence of vaginal wall laceration. No erythema or purulent drainage. No concern for secondary infection or cellulitis. Gonorrhea and chlamydia cultures pending.  Patient also concerned about anorexia and fatigue following delivery. She has no electrolyte abnormalities. No acute kidney injury to suggest dehydration. Hemoglobin and hematocrit are reassuring. Vitals stable. Low suspicion for emergent etiology.  Patient with further complaints of pleuritic chest pain. She has had no hypoxia since arrival. Given recent pregnancy, a d-dimer was performed. This is negative today. Patient is otherwise low risk for pulmonary embolus. I do not believe this to be the cause of her symptoms. More likely symptoms are due to musculoskeletal etiology. Will manage supportively.  The patient has been advised follow-up with her OB/GYN regarding her visit today. She has been told to call the health department in 2 days regarding the results of her STD tests. No indication for further emergent workup. Patient discharged in stable condition with no unaddressed concerns.   Final Clinical Impressions(s) / ED Diagnoses   Final diagnoses:  Pleuritic  chest pain  Acute vaginitis    New Prescriptions New Prescriptions   No medications on file     Antony Madura, PA-C 02/17/16 2350    Doug Sou, MD 02/18/16 6962

## 2016-02-17 NOTE — ED Triage Notes (Signed)
Pt reports having vaginal delivery 2 months ago. Did not wait the 6 weeks before having sex, also did not go to followup dr appt. Pt still has vaginal bleeding and foul odor. Reports lack of appetite and weight loss.

## 2016-02-17 NOTE — ED Notes (Signed)
Called patient 3x, no response. 

## 2016-02-17 NOTE — Discharge Instructions (Signed)
Your workup today was reassuring. We advise that you drink plenty of fluids to prevent dehydration. Eat regular meals throughout the day. Follow-up on the results of your STD in 48 hours with the health department. We advise you to follow-up with your OB/GYN if symptoms persist. [facility] if symptoms persist or worsen.

## 2016-02-20 LAB — GC/CHLAMYDIA PROBE AMP (~~LOC~~) NOT AT ARMC
CHLAMYDIA, DNA PROBE: NEGATIVE
NEISSERIA GONORRHEA: NEGATIVE

## 2016-05-15 ENCOUNTER — Encounter (HOSPITAL_COMMUNITY): Payer: Self-pay | Admitting: Emergency Medicine

## 2016-05-15 ENCOUNTER — Emergency Department (HOSPITAL_COMMUNITY)
Admission: EM | Admit: 2016-05-15 | Discharge: 2016-05-15 | Disposition: A | Discharge status: Home / Self Care | Payer: Medicaid Other | Attending: Emergency Medicine | Admitting: Emergency Medicine

## 2016-05-15 DIAGNOSIS — J029 Acute pharyngitis, unspecified: Secondary | ICD-10-CM | POA: Diagnosis present

## 2016-05-15 DIAGNOSIS — Z79899 Other long term (current) drug therapy: Secondary | ICD-10-CM | POA: Diagnosis not present

## 2016-05-15 DIAGNOSIS — F17210 Nicotine dependence, cigarettes, uncomplicated: Secondary | ICD-10-CM | POA: Insufficient documentation

## 2016-05-15 LAB — RAPID STREP SCREEN (MED CTR MEBANE ONLY): Streptococcus, Group A Screen (Direct): NEGATIVE

## 2016-05-15 NOTE — Discharge Planning (Signed)
Mackenzie Santiago J. Lucretia Roers, RN, BSN, [place] [phone]  [facility] set up appointment at [facility] on 5/8 at 1000.

## 2016-05-15 NOTE — ED Triage Notes (Signed)
Pt c/o "drainage down the back of throat" x 2 days. Reports seeing hreads of blood when she tries to cough coughs. Pt reports a 2 month hx of l/hip pain, 2 week hx of pain in l/ribs-resolved 2 days ago. Denies NVD. Not been seen by PCP for follow up since day of delivery

## 2016-05-15 NOTE — ED Provider Notes (Signed)
[facility]-EMERGENCY DEPT Provider Note   CSN: [id number] Arrival date & time: 05/15/16  1244   By signing my name below, I, Freida Busman, attest that this documentation has been prepared under the direction and in the presence of [facility], PA-C. Electronically Signed: Freida Busman, Scribe. 05/15/2016. 1:26 PM.  History   Chief Complaint Chief Complaint  Patient presents with  . Sore Throat    The history is provided by the patient. No language interpreter was used.   HPI Comments:  Mackenzie Santiago is a 21 y.o. female who presents to the Emergency Department complaining of a mild sore throat x 2-3 days. No pain when swallowing. She reports associated postnasal drip. Pt reports blood tinged phlegm for the last few days. Pt notes mint tea has provided some relief. No other treatments tried PTA. She denies cough, rhinorrhea, fever, chills, ear pain, and SOB. Pt is a current smoker. She missed 6 week postpartum check 4 months ago and has not been evaluated since; Pt notes son with cough; he is not in daycare and has been to all his pediatric appointments.   Past Medical History:  Diagnosis Date  . Heart murmur   . Thyroid disease   . Trichimoniasis     Patient Active Problem List   Diagnosis Date Noted  . Obstetric labial laceration, delivered, current hospitalization 11/23/2015  . SVD (spontaneous vaginal delivery) 11/22/2015  . H/O anxiety state 11/22/2015  . Cigarette smoker 11/22/2015  . H/O sickle cell trait (FOB neg) 11/22/2015  . H/O chlamydia infection (neg TOC this pregnancy) 11/22/2015  . Group B streptococcal bacteriuria 11/22/2015  . H/O: depression 11/21/2015    Past Surgical History:  Procedure Laterality Date  . NO PAST SURGERIES      OB History    Gravida Para Term Preterm AB Living   SAB TAB Ectopic Multiple Live Births         0 1       Home Medications    Prior to Admission medications   Medication Sig Start Date End Date Taking?  Authorizing Provider  ibuprofen (ADVIL,MOTRIN) 600 MG tablet Take 1 tablet (600 mg total) by mouth every 6 (six) hours as needed for mild pain, moderate pain or cramping. 11/23/15   Osborn Coho, MD  Prenatal Vit-Fe Fumarate-FA (PRENATAL MULTIVITAMIN) TABS tablet Take 1 tablet by mouth daily at 12 noon.    Historical Provider, MD    Family History Family History  Problem Relation Age of Onset  . Sickle cell anemia Father   . Sickle cell anemia Maternal Grandfather   . Thyroid disease Paternal Grandmother   . Sickle cell anemia Paternal Grandfather   . Hypertension Mother   . Alcohol abuse Neg Hx   . Arthritis Neg Hx   . Asthma Neg Hx   . Birth defects Neg Hx   . Cancer Neg Hx   . COPD Neg Hx   . Depression Neg Hx   . Diabetes Neg Hx   . Drug abuse Neg Hx   . Early death Neg Hx   . Hearing loss Neg Hx   . Heart disease Neg Hx   . Hyperlipidemia Neg Hx   . Kidney disease Neg Hx   . Learning disabilities Neg Hx   . Mental illness Neg Hx   . Mental retardation Neg Hx   . Miscarriages / Stillbirths Neg Hx   . Stroke Neg Hx   . Vision  loss Neg Hx   . Varicose Veins Neg Hx     Social History Social History  Substance Use Topics  . Smoking status: Current Every Day Smoker    Packs/day: 0.25    Types: Cigarettes  . Smokeless tobacco: Current User  . Alcohol use No     Comment: social     Allergies   Blueberry [vaccinium angustifolium]   Review of Systems Review of Systems  Constitutional: Negative for chills and fever.  HENT: Positive for postnasal drip and sore throat. Negative for ear pain and rhinorrhea.   Respiratory: Negative for cough and shortness of breath.   Cardiovascular: Negative for chest pain.  Gastrointestinal: Negative for constipation.  Neurological: Positive for headaches.   Physical Exam Updated Vital Signs BP 113/70 (BP Location: Left Arm)   Pulse 84   Temp 99 F (37.2 C) (Oral)   Resp 16   Ht  (1.549 m)   Wt 45.6 kg   SpO2 100%    Breastfeeding? No   BMI 18.99 kg/m   Physical Exam  Constitutional: She appears well-developed and well-nourished. No distress.  HENT:  Head: Normocephalic and atraumatic.  Oropharynx with 2+ tonsillar hypertrophy; PND  No exudate  No focal areas of sinus tenderness   Neck: Neck supple.  Cardiovascular: Normal rate, regular rhythm and normal heart sounds.   No murmur heard. Pulmonary/Chest: Effort normal and breath sounds normal. No respiratory distress. She has no wheezes. She has no rales.  Musculoskeletal: Normal range of motion.  Neurological: She is alert.  Skin: Skin is warm and dry.  Nursing note and vitals reviewed.    ED Treatments / Results  DIAGNOSTIC STUDIES:  Oxygen Saturation is 100% on RA, normal by my interpretation.    COORDINATION OF CARE:  1:18 PM Discussed treatment plan with pt at bedside and pt agreed to plan.  Labs (all labs ordered are listed, but only abnormal results are displayed) Labs Reviewed  RAPID STREP SCREEN (NOT AT [facility])  CULTURE, GROUP A STREP [facility])    EKG  EKG Interpretation None       Radiology No results found.  Procedures Procedures (including critical care time)  Medications Ordered in ED Medications - No data to display   Initial Impression / Assessment and Plan / ED Course  I have reviewed the triage vital signs and the nursing notes.  Pertinent labs & imaging results that were available during my care of the patient were reviewed by me and considered in my medical decision making (see chart for details).    Mackenzie Santiago is a 21 y.o. female who presents to ED for sore throat. Rapid strep negative. Symptomatic home care instructions discussed. No evidence of dehydration. Pt is tolerating secretions. Presentation not concerning for peritonsillar abscess or spread of infection to deep spaces of the throat; patent airway. Specific return precautions discussed. Patient does not have PCP - has a 70 month old  child and missed follow up visit. Tried to call OBGYN and was told she needs to have a PCP since she is now 5 months post-partum. She seemed confused about insurance and who to follow up with for routine appointments. Social work consulted - greatly appreciate assistance with care - she [facility] next [facility]. Informed of appointment. All questions answered.    Final Clinical Impressions(s) / ED Diagnoses   Final diagnoses:  Viral pharyngitis    New Prescriptions Discharge Medication List as of 05/15/2016  2:23 PM  I personally performed the services described in this documentation, which was scribed in my presence. The recorded information has been reviewed and is accurate.    [facility] Shandrika Ambers, PA-C 05/15/16 1530    Azalia Bilis, MD 05/15/16 [phone]

## 2016-05-15 NOTE — Discharge Instructions (Signed)
Alternate between Tylenol and ibuprofen as needed for pain. Gargle warm salt water and spit it out. It is very important to stay hydrated!  You have an appointment with a primary care doctor on 5/08 - see information listed. Return to emergency department if any new or worsening of symptoms develop or you have any additional concerns.

## 2016-05-17 LAB — CULTURE, GROUP A STREP (THRC)

## 2016-05-22 ENCOUNTER — Ambulatory Visit: Payer: Medicaid Other | Admitting: Family Medicine

## 2016-06-20 ENCOUNTER — Ambulatory Visit (HOSPITAL_COMMUNITY)
Admission: EM | Admit: 2016-06-20 | Discharge: 2016-06-20 | Disposition: A | Discharge status: Home / Self Care | Payer: Medicaid Other | Attending: Internal Medicine | Admitting: Internal Medicine

## 2016-06-20 ENCOUNTER — Encounter (HOSPITAL_COMMUNITY): Payer: Self-pay | Admitting: Family Medicine

## 2016-06-20 DIAGNOSIS — N939 Abnormal uterine and vaginal bleeding, unspecified: Secondary | ICD-10-CM | POA: Diagnosis not present

## 2016-06-20 DIAGNOSIS — N898 Other specified noninflammatory disorders of vagina: Secondary | ICD-10-CM | POA: Diagnosis not present

## 2016-06-20 DIAGNOSIS — Z3202 Encounter for pregnancy test, result negative: Secondary | ICD-10-CM | POA: Diagnosis not present

## 2016-06-20 LAB — POCT URINALYSIS DIP (DEVICE)
BILIRUBIN URINE: NEGATIVE
Glucose, UA: NEGATIVE mg/dL
Ketones, ur: NEGATIVE mg/dL
Leukocytes, UA: NEGATIVE
NITRITE: NEGATIVE
PH: 7 (ref 5.0–8.0)
PROTEIN: NEGATIVE mg/dL
Specific Gravity, Urine: 1.015 (ref 1.005–1.030)
UROBILINOGEN UA: 0.2 mg/dL (ref 0.0–1.0)

## 2016-06-20 LAB — POCT PREGNANCY, URINE: PREG TEST UR: NEGATIVE

## 2016-06-20 NOTE — Discharge Instructions (Signed)
Please go directly to [facility] as they can provide you with the care that you need.

## 2016-06-20 NOTE — ED Provider Notes (Signed)
CSN: [id number]     Arrival date & time 06/20/16  1554 History   None    Chief Complaint  Patient presents with  . Vaginal Discharge   (Consider location/radiation/quality/duration/timing/severity/associated sxs/prior Treatment) Vaginal discharge for 7 months started after having her first son.  The discharge is dark and does not have a smell. She denies vaginal pain. Says that she has this discharge every time she changes her underwear. She says that it is not getting better. Most recent intercourse was early March with the child's father.  Found out that father of her baby was cheating on her 2 months ago and she says that he is asymptomatic. She tells me she is moving her bowels normally for her.  She did not attend her GYN follow ups because she was afraid to leave her baby alone. She denies dizziness today. She has never required a blood transfusion.      Past Medical History:  Diagnosis Date  . Heart murmur   . Thyroid disease   . Trichimoniasis    Past Surgical History:  Procedure Laterality Date  . NO PAST SURGERIES     Family History  Problem Relation Age of Onset  . Sickle cell anemia Father   . Sickle cell anemia Maternal Grandfather   . Thyroid disease Paternal Grandmother   . Sickle cell anemia Paternal Grandfather   . Hypertension Mother   . Alcohol abuse Neg Hx   . Arthritis Neg Hx   . Asthma Neg Hx   . Birth defects Neg Hx   . Cancer Neg Hx   . COPD Neg Hx   . Depression Neg Hx   . Diabetes Neg Hx   . Drug abuse Neg Hx   . Early death Neg Hx   . Hearing loss Neg Hx   . Heart disease Neg Hx   . Hyperlipidemia Neg Hx   . Kidney disease Neg Hx   . Learning disabilities Neg Hx   . Mental illness Neg Hx   . Mental retardation Neg Hx   . Miscarriages / Stillbirths Neg Hx   . Stroke Neg Hx   . Vision loss Neg Hx   . Varicose Veins Neg Hx    Social History  Substance Use Topics  . Smoking status: Current Every Day Smoker    Packs/day: 0.25    Types:  Cigarettes  . Smokeless tobacco: Current User  . Alcohol use No     Comment: social   OB History    Gravida Para Term Preterm AB Living   1 1 1     1    SAB TAB Ectopic Multiple Live Births         0 1     Review of Systems  Constitutional: Negative for chills and fever.  Respiratory: Negative for shortness of breath.   Gastrointestinal: Negative for abdominal pain, nausea and vomiting.  Genitourinary: Negative for dysuria, frequency and urgency.  Musculoskeletal: Negative for back pain and myalgias.  Skin: Negative for rash.  Neurological: Negative for dizziness and headaches.    Allergies  Blueberry [vaccinium angustifolium]  Home Medications   Prior to Admission medications   Medication Sig Start Date End Date Taking? Authorizing Provider  Prenatal Vit-Fe Fumarate-FA (PRENATAL MULTIVITAMIN) TABS tablet Take 1 tablet by mouth daily at 12 noon.    [provider]   Meds Ordered and Administered this Visit  Medications - No data to display  BP 135/79   Pulse 78   Temp  98.6 F (37 C)   Resp 18   LMP 04/08/2016   SpO2 100%  No data found.   Physical Exam  Constitutional:  She is very thin.  Cardiovascular: Normal rate.   Pulmonary/Chest: Effort normal.  Abdominal: She exhibits no mass. There is no tenderness.  Skin: She is not diaphoretic.    Urgent Care Course     Procedures (including critical care time)  Labs Review Labs Reviewed  POCT URINALYSIS DIP (DEVICE) - Abnormal; Notable for the following:       Result Value   Hgb urine dipstick LARGE (*)    All other components within normal limits  POCT PREGNANCY, URINE     MDM   1. Vaginal bleeding    Her vitals are stable however she tells me she has been bleeding consistently for about 7 months. Given her complaint I think she would benefit from a CBC and possibly an ultrasound as she may have some retained products of conception.  She would like some answers tonight and thus its [facility].     Ofilia NeasClark, Michael L, [place] 06/20/16 1721

## 2016-06-20 NOTE — ED Triage Notes (Signed)
Pt here for vaginal discharge for 7 months. sts 1 partner with unprotected sex. sts that she missed her 6 weeks post partum appointment after having her son 7 months ago. sts intermittent stomach pain.

## 2016-07-12 ENCOUNTER — Encounter (HOSPITAL_COMMUNITY): Payer: Self-pay | Admitting: *Deleted

## 2016-07-12 ENCOUNTER — Inpatient Hospital Stay (HOSPITAL_COMMUNITY)
Admission: AD | Admit: 2016-07-12 | Discharge: 2016-07-12 | Disposition: A | Discharge status: Home / Self Care | Payer: Medicaid Other | Source: Ambulatory Visit | Attending: Obstetrics & Gynecology | Admitting: Obstetrics & Gynecology

## 2016-07-12 DIAGNOSIS — E0590 Thyrotoxicosis, unspecified without thyrotoxic crisis or storm: Secondary | ICD-10-CM | POA: Diagnosis not present

## 2016-07-12 DIAGNOSIS — Z8349 Family history of other endocrine, nutritional and metabolic diseases: Secondary | ICD-10-CM | POA: Diagnosis not present

## 2016-07-12 DIAGNOSIS — Z3202 Encounter for pregnancy test, result negative: Secondary | ICD-10-CM | POA: Diagnosis present

## 2016-07-12 DIAGNOSIS — Z79899 Other long term (current) drug therapy: Secondary | ICD-10-CM | POA: Diagnosis not present

## 2016-07-12 DIAGNOSIS — Z832 Family history of diseases of the blood and blood-forming organs and certain disorders involving the immune mechanism: Secondary | ICD-10-CM | POA: Diagnosis not present

## 2016-07-12 DIAGNOSIS — Z888 Allergy status to other drugs, medicaments and biological substances status: Secondary | ICD-10-CM | POA: Diagnosis not present

## 2016-07-12 DIAGNOSIS — Z8249 Family history of ischemic heart disease and other diseases of the circulatory system: Secondary | ICD-10-CM | POA: Diagnosis not present

## 2016-07-12 DIAGNOSIS — N939 Abnormal uterine and vaginal bleeding, unspecified: Secondary | ICD-10-CM | POA: Diagnosis not present

## 2016-07-12 DIAGNOSIS — F17210 Nicotine dependence, cigarettes, uncomplicated: Secondary | ICD-10-CM | POA: Diagnosis not present

## 2016-07-12 DIAGNOSIS — R109 Unspecified abdominal pain: Secondary | ICD-10-CM | POA: Diagnosis not present

## 2016-07-12 LAB — CBC
HEMATOCRIT: 37.5 % (ref 36.0–46.0)
HEMOGLOBIN: 12.9 g/dL (ref 12.0–15.0)
MCH: 25 pg — ABNORMAL LOW (ref 26.0–34.0)
MCHC: 34.4 g/dL (ref 30.0–36.0)
MCV: 72.5 fL — ABNORMAL LOW (ref 78.0–100.0)
Platelets: 222 10*3/uL (ref 150–400)
RBC: 5.17 MIL/uL — ABNORMAL HIGH (ref 3.87–5.11)
RDW: 13.9 % (ref 11.5–15.5)
WBC: 4.7 10*3/uL (ref 4.0–10.5)

## 2016-07-12 LAB — WET PREP, GENITAL
Clue Cells Wet Prep HPF POC: NONE SEEN
SPERM: NONE SEEN
Trich, Wet Prep: NONE SEEN
YEAST WET PREP: NONE SEEN

## 2016-07-12 LAB — URINALYSIS, ROUTINE W REFLEX MICROSCOPIC

## 2016-07-12 LAB — POCT PREGNANCY, URINE: Preg Test, Ur: NEGATIVE

## 2016-07-12 LAB — URINALYSIS, MICROSCOPIC (REFLEX)

## 2016-07-12 MED ORDER — NORGESTIMATE-ETH ESTRADIOL 0.25-35 MG-MCG PO TABS: 1.0000 | ORAL_TABLET | Freq: Every day | ORAL | 0 refills | Status: DC

## 2016-07-12 NOTE — MAU Provider Note (Signed)
History     CSN: [id number]  Arrival date and time: 07/12/16 1243   First Provider Initiated Contact with Patient 07/12/16 1507      Chief Complaint  Patient presents with  . Vaginal Bleeding  . Abdominal Pain   G1P1 non-pregnant female here with VB. Reports lite, brown bleeding since the birth of her son almost 8 mos ago. Over the last 3 days the bleeding has increased and become red. She is changing pads q1-2 hrs. No vaginal discharge, itching, or odor. She reports using feminine wash regularly inside the vagina. Denies sexual activity in the last 4 months. She has remote hx of trich. She reports hx of hyperthyroidism and possible need for surgery before her last pregnancy but she did not follow up. Pt requests contraception (Nexplanon).    Past Medical History:  Diagnosis Date  . Heart murmur   . Thyroid disease   . Trichimoniasis     Past Surgical History:  Procedure Laterality Date  . NO PAST SURGERIES      Family History  Problem Relation Age of Onset  . Sickle cell anemia Father   . Sickle cell anemia Maternal Grandfather   . Thyroid disease Paternal Grandmother   . Sickle cell anemia Paternal Grandfather   . Hypertension Mother   . Alcohol abuse Neg Hx   . Arthritis Neg Hx   . Asthma Neg Hx   . Birth defects Neg Hx   . Cancer Neg Hx   . COPD Neg Hx   . Depression Neg Hx   . Diabetes Neg Hx   . Drug abuse Neg Hx   . Early death Neg Hx   . Hearing loss Neg Hx   . Heart disease Neg Hx   . Hyperlipidemia Neg Hx   . Kidney disease Neg Hx   . Learning disabilities Neg Hx   . Mental illness Neg Hx   . Mental retardation Neg Hx   . Miscarriages / Stillbirths Neg Hx   . Stroke Neg Hx   . Vision loss Neg Hx   . Varicose Veins Neg Hx     Social History  Substance Use Topics  . Smoking status: Current Every Day Smoker    Packs/day: 0.25    Types: Cigarettes  . Smokeless tobacco: Current User  . Alcohol use No     Comment: social    Allergies:   Allergies  Allergen Reactions  . Blueberry [Vaccinium Angustifolium] Hives         Prescriptions Prior to Admission  Medication Sig Dispense Refill Last Dose  . Prenatal Vit-Fe Fumarate-FA (PRENATAL MULTIVITAMIN) TABS tablet Take 1 tablet by mouth daily at 12 noon.   02/16/2016 at Unknown time    Review of Systems  Gastrointestinal: Negative for abdominal pain.  Genitourinary: Positive for vaginal bleeding.   Physical Exam   Blood pressure 113/68, pulse 89, temperature 98 F (36.7 C), temperature source Oral, resp. rate 18, height 5\' 2"  (1.575 m), weight 44 kg (97 lb), not currently breastfeeding.  Physical Exam  Nursing note and vitals reviewed. Constitutional: She is oriented to person, place, and time. She appears well-developed and well-nourished. No distress.  HENT:  Head: Normocephalic and atraumatic.  Neck: Normal range of motion.  Cardiovascular: Normal rate.   Respiratory: Effort normal.  GI: Soft. She exhibits no distension and no mass. There is no tenderness. There is no rebound and no guarding.  Genitourinary:  Genitourinary Comments: External: no lesions or erythema Vagina: rugated, pink, moist,  small drk red bloody discharge Uterus: non enlarged, anteverted, + tender, no CMT Adnexae: no masses, no tenderness left, no tenderness right   Musculoskeletal: Normal range of motion.  Neurological: She is alert and oriented to person, place, and time.  Skin: Skin is warm and dry.  Psychiatric: She has a normal mood and affect.   Results for orders placed or performed during the hospital encounter of 07/12/16 (from the past 24 hour(s))  Urinalysis, Routine w reflex microscopic     Status: Abnormal   Collection Time: 07/12/16  1:42 PM  Result Value Ref Range   Color, Urine RED (A) YELLOW   APPearance TURBID (A) CLEAR   Specific Gravity, Urine  1.005 - 1.030    TEST NOT REPORTED DUE TO COLOR INTERFERENCE OF URINE PIGMENT   pH  5.0 - 8.0    TEST NOT REPORTED DUE TO  COLOR INTERFERENCE OF URINE PIGMENT   Glucose, UA (A) NEGATIVE mg/dL    TEST NOT REPORTED DUE TO COLOR INTERFERENCE OF URINE PIGMENT   Hgb urine dipstick (A) NEGATIVE    TEST NOT REPORTED DUE TO COLOR INTERFERENCE OF URINE PIGMENT   Bilirubin Urine (A) NEGATIVE    TEST NOT REPORTED DUE TO COLOR INTERFERENCE OF URINE PIGMENT   Ketones, ur (A) NEGATIVE mg/dL    TEST NOT REPORTED DUE TO COLOR INTERFERENCE OF URINE PIGMENT   Protein, ur (A) NEGATIVE mg/dL    TEST NOT REPORTED DUE TO COLOR INTERFERENCE OF URINE PIGMENT   Nitrite (A) NEGATIVE    TEST NOT REPORTED DUE TO COLOR INTERFERENCE OF URINE PIGMENT   Leukocytes, UA (A) NEGATIVE    TEST NOT REPORTED DUE TO COLOR INTERFERENCE OF URINE PIGMENT  Urinalysis, Microscopic (reflex)     Status: Abnormal   Collection Time: 07/12/16  1:42 PM  Result Value Ref Range   RBC / HPF TOO NUMEROUS TO COUNT 0 - 5 RBC/hpf   WBC, UA 0-5 0 - 5 WBC/hpf   Bacteria, UA FEW (A) NONE SEEN   Squamous Epithelial / LPF 0-5 (A) NONE SEEN  CBC     Status: Abnormal   Collection Time: 07/12/16  1:53 PM  Result Value Ref Range   WBC 4.7 4.0 - 10.5 K/uL   RBC 5.17 (H) 3.87 - 5.11 MIL/uL   Hemoglobin 12.9 12.0 - 15.0 g/dL   HCT 16.137.5 09.636.0 - 04.546.0 %   MCV 72.5 (L) 78.0 - 100.0 fL   MCH 25.0 (L) 26.0 - 34.0 pg   MCHC 34.4 30.0 - 36.0 g/dL   RDW 40.913.9 81.111.5 - 91.415.5 %   Platelets 222 150 - 400 K/uL  Pregnancy, urine POC     Status: None   Collection Time: 07/12/16  1:58 PM  Result Value Ref Range   Preg Test, Ur NEGATIVE NEGATIVE   MAU Course  Procedures  MDM Labs ordered and reviewed. No evidence of acute pelvic process. Will use OCP taper to minimize AUB and provide contraception.  Assessment and Plan   1. Abnormal uterine bleeding (AUB)   2. Negative pregnancy test    Discharge home Follow up with [facility] in 2 months Rx Sprintec  Allergies as of 07/12/2016      Reactions   Blueberry [vaccinium Angustifolium] Hives         Medication List    TAKE these  medications   norgestimate-ethinyl estradiol 0.25-35 MG-MCG tablet Commonly known as:  ORTHO-CYCLEN,SPRINTEC,PREVIFEM Take 1 tablet by mouth daily. Take 2 tablets each day until bleeding  stops then 1 tablet daily      Donette Larry, [place] 07/12/2016, 3:22 PM

## 2016-07-12 NOTE — MAU Note (Signed)
Pt had vag delivery on 11/22/2015, started on depo after delivery, didn't get the shot due in Feb.  Pt continued to have bleeding, has continued to bleed ever since.  Bleeding was very dark, is now bright red.  Is changing a pad every hour - this started 2 days ago.  Is now having lower abd pain.  Has been dizzy for the last couple of days.

## 2016-07-12 NOTE — MAU Note (Signed)
C/o vaginal bleeding for past 8 months; hx of thyroid disease; missed her Depo shot in Feb 2nd; pt has had bleeding ever since she gave birth;

## 2016-07-12 NOTE — Discharge Instructions (Signed)

## 2016-07-13 LAB — GC/CHLAMYDIA PROBE AMP (~~LOC~~) NOT AT ARMC
Chlamydia: NEGATIVE
Neisseria Gonorrhea: NEGATIVE

## 2016-12-10 ENCOUNTER — Emergency Department (HOSPITAL_COMMUNITY)
Admission: EM | Admit: 2016-12-10 | Discharge: 2016-12-10 | Disposition: A | Discharge status: Home / Self Care | Payer: Medicaid Other | Attending: Emergency Medicine | Admitting: Emergency Medicine

## 2016-12-10 ENCOUNTER — Encounter (HOSPITAL_COMMUNITY): Payer: Self-pay | Admitting: Emergency Medicine

## 2016-12-10 DIAGNOSIS — H6990 Unspecified Eustachian tube disorder, unspecified ear: Secondary | ICD-10-CM | POA: Insufficient documentation

## 2016-12-10 DIAGNOSIS — F17210 Nicotine dependence, cigarettes, uncomplicated: Secondary | ICD-10-CM | POA: Insufficient documentation

## 2016-12-10 DIAGNOSIS — H6980 Other specified disorders of Eustachian tube, unspecified ear: Secondary | ICD-10-CM

## 2016-12-10 DIAGNOSIS — E079 Disorder of thyroid, unspecified: Secondary | ICD-10-CM | POA: Insufficient documentation

## 2016-12-10 DIAGNOSIS — Z79899 Other long term (current) drug therapy: Secondary | ICD-10-CM | POA: Insufficient documentation

## 2016-12-10 DIAGNOSIS — H9202 Otalgia, left ear: Secondary | ICD-10-CM | POA: Diagnosis present

## 2016-12-10 LAB — CBC
HCT: 34.1 % — ABNORMAL LOW (ref 36.0–46.0)
Hemoglobin: 11.6 g/dL — ABNORMAL LOW (ref 12.0–15.0)
MCH: 24.6 pg — ABNORMAL LOW (ref 26.0–34.0)
MCHC: 34 g/dL (ref 30.0–36.0)
MCV: 72.2 fL — ABNORMAL LOW (ref 78.0–100.0)
Platelets: 202 10*3/uL (ref 150–400)
RBC: 4.72 MIL/uL (ref 3.87–5.11)
RDW: 14.5 % (ref 11.5–15.5)
WBC: 5.2 10*3/uL (ref 4.0–10.5)

## 2016-12-10 LAB — LIPASE, BLOOD: Lipase: 28 U/L (ref 11–51)

## 2016-12-10 LAB — COMPREHENSIVE METABOLIC PANEL
ALT: 11 U/L — ABNORMAL LOW (ref 14–54)
AST: 18 U/L (ref 15–41)
Albumin: 4.1 g/dL (ref 3.5–5.0)
Alkaline Phosphatase: 76 U/L (ref 38–126)
Anion gap: 6 (ref 5–15)
BUN: 10 mg/dL (ref 6–20)
CO2: 25 mmol/L (ref 22–32)
Calcium: 9.2 mg/dL (ref 8.9–10.3)
Chloride: 109 mmol/L (ref 101–111)
Creatinine, Ser: 0.59 mg/dL (ref 0.44–1.00)
GFR calc Af Amer: >60 mL/min (ref >60)
GFR calc non Af Amer: >60 mL/min (ref >60)
Glucose, Bld: 92 mg/dL (ref 65–99)
Potassium: 3.4 mmol/L — ABNORMAL LOW (ref 3.5–5.1)
Sodium: 140 mmol/L (ref 135–145)
Total Bilirubin: 0.3 mg/dL (ref 0.3–1.2)
Total Protein: 7 g/dL (ref 6.5–8.1)

## 2016-12-10 LAB — I-STAT BETA HCG BLOOD, ED (MC, WL, AP ONLY): I-stat hCG, quantitative: <5 m[IU]/mL (ref <5)

## 2016-12-10 MED ORDER — FEXOFENADINE-PSEUDOEPHED ER 60-120 MG PO TB12: 1.0000 | ORAL_TABLET | Freq: Two times a day (BID) | ORAL | 0 refills | Status: DC

## 2016-12-10 MED ORDER — FLUTICASONE PROPIONATE 50 MCG/ACT NA SUSP: 2.0000 | Freq: Every day | NASAL | 0 refills | Status: DC

## 2016-12-10 NOTE — ED Triage Notes (Addendum)
Per EMS. Pt reports she woke up with L ear fullness and ringing. Pt reports she gets dizzy (room spinning sensation) when she is in certain positions.  Pt also reports her last menstrual cycle was in October, and she has been having nausea and migraines at home. Pt reports she just threw up due to dizziness.

## 2016-12-10 NOTE — ED Provider Notes (Signed)
[facility]-EMERGENCY DEPT Provider Note   CSN: [id number] Arrival date & time: 12/10/16  0932     History   Chief Complaint Chief Complaint  Patient presents with  . Otalgia  . Emesis    HPI Mackenzie Santiago is a 21 y.o. female.  HPI   21 year old female with left ear pressure and ringing.  She is felt congested for the past several days.  This morning she woke up with a lot of pressure in her ear.  Does not really feel painful.  Says it feels like "when you go swimming and when you jumped in the side of your head slapped the water."  No drainage.  No fevers or chills.  No sore throat.  Has felt dizzy which she describes a sensation of things spinning/swelling.  Past Medical History:  Diagnosis Date  . Heart murmur   . Thyroid disease   . Trichimoniasis     Patient Active Problem List   Diagnosis Date Noted  . Obstetric labial laceration, delivered, current hospitalization 11/23/2015  . SVD (spontaneous vaginal delivery) 11/22/2015  . H/O anxiety state 11/22/2015  . Cigarette smoker 11/22/2015  . H/O sickle cell trait (FOB neg) 11/22/2015  . H/O chlamydia infection (neg TOC this pregnancy) 11/22/2015  . Group B streptococcal bacteriuria 11/22/2015  . H/O: depression 11/21/2015    Past Surgical History:  Procedure Laterality Date  . NO PAST SURGERIES      OB History    Gravida Para Term Preterm AB Living   1 1 1     1    SAB TAB Ectopic Multiple Live Births         0 1       Home Medications    Prior to Admission medications   Medication Sig Start Date End Date Taking? Authorizing Provider  norgestimate-ethinyl estradiol (ORTHO-CYCLEN,SPRINTEC,PREVIFEM) 0.25-35 MG-MCG tablet Take 1 tablet by mouth daily. Take 2 tablets each day until bleeding stops then 1 tablet daily 07/12/16   Donette LarryBhambri, Melanie, CNM    Family History Family History  Problem Relation Age of Onset  . Sickle cell anemia Father   . Sickle cell anemia Maternal  Grandfather   . Thyroid disease Paternal Grandmother   . Sickle cell anemia Paternal Grandfather   . Hypertension Mother   . Alcohol abuse Neg Hx   . Arthritis Neg Hx   . Asthma Neg Hx   . Birth defects Neg Hx   . Cancer Neg Hx   . COPD Neg Hx   . Depression Neg Hx   . Diabetes Neg Hx   . Drug abuse Neg Hx   . Early death Neg Hx   . Hearing loss Neg Hx   . Heart disease Neg Hx   . Hyperlipidemia Neg Hx   . Kidney disease Neg Hx   . Learning disabilities Neg Hx   . Mental illness Neg Hx   . Mental retardation Neg Hx   . Miscarriages / Stillbirths Neg Hx   . Stroke Neg Hx   . Vision loss Neg Hx   . Varicose Veins Neg Hx     Social History Social History   Tobacco Use  . Smoking status: Current Every Day Smoker    Packs/day: 0.25    Types: Cigarettes  . Smokeless tobacco: Current User  Substance Use Topics  . Alcohol use: No    Comment: social  . Drug use: No     Allergies   Blueberry [vaccinium angustifolium]  Review of Systems Review of Systems  All systems reviewed and negative, other than as noted in HPI.  Physical Exam Updated Vital Signs BP 108/71 (BP Location: Left Arm)   Pulse 75   Temp 98.6 F (37 C) (Oral)   Resp 18   Ht 5\' 1"  (1.549 m)   Wt 44.5 kg (98 lb)   SpO2 100%   BMI 18.52 kg/m   Physical Exam  Constitutional: She appears well-developed and well-nourished. No distress.  HENT:  Head: Normocephalic and atraumatic.  Right Ear: External ear normal.  Left Ear: External ear normal.  TMs clear bilaterally.  Oropharynx clear.  Neck is supple.  No adenopathy.  No nystagmus appreciated.  Eyes: Conjunctivae and EOM are normal. Pupils are equal, round, and reactive to light. Right eye exhibits no discharge. Left eye exhibits no discharge.  Neck: Neck supple.  Cardiovascular: Normal rate, regular rhythm and normal heart sounds. Exam reveals no gallop and no friction rub.  No murmur heard. Pulmonary/Chest: Effort normal and breath sounds  normal. No respiratory distress.  Abdominal: Soft. She exhibits no distension. There is no tenderness.  Musculoskeletal: She exhibits no edema or tenderness.  Neurological: She is alert.  Skin: Skin is warm and dry.  Psychiatric: She has a normal mood and affect. Her behavior is normal. Thought content normal.  Nursing note and vitals reviewed.    ED Treatments / Results  Labs (all labs ordered are listed, but only abnormal results are displayed) Labs Reviewed  COMPREHENSIVE METABOLIC PANEL - Abnormal; Notable for the following components:      Result Value   Potassium 3.4 (*)    ALT 11 (*)    All other components within normal limits  CBC - Abnormal; Notable for the following components:   Hemoglobin 11.6 (*)    HCT 34.1 (*)    MCV 72.2 (*)    MCH 24.6 (*)    All other components within normal limits  LIPASE, BLOOD  URINALYSIS, ROUTINE W REFLEX MICROSCOPIC  I-STAT BETA HCG BLOOD, ED ([facility], [facility], [facility] ONLY)    EKG  EKG Interpretation None       Radiology No results found.  Procedures Procedures (including critical care time)  Medications Ordered in ED Medications - No data to display   Initial Impression / Assessment and Plan / ED Course  I have reviewed the triage vital signs and the nursing notes.  Pertinent labs & imaging results that were available during my care of the patient were reviewed by me and considered in my medical decision making (see chart for details).     Eustachian tube dysfunction? Ext aud canal and TMs are normal. Overall has a nonfocal exam.  Plan symptomatic treatment.  Doubt emergent process.  Return precautions were discussed.  Final Clinical Impressions(s) / ED Diagnoses   Final diagnoses:  Dysfunction of Eustachian tube, unspecified laterality    ED Discharge Orders    None       Raeford RazorKohut, Tou Hayner, MD 12/11/16 1100

## 2017-12-10 ENCOUNTER — Emergency Department (HOSPITAL_COMMUNITY)
Admission: EM | Admit: 2017-12-10 | Discharge: 2017-12-11 | Disposition: A | Discharge status: Home / Self Care | Payer: Medicaid Other | Attending: Emergency Medicine | Admitting: Emergency Medicine

## 2017-12-10 ENCOUNTER — Other Ambulatory Visit: Payer: Self-pay

## 2017-12-10 DIAGNOSIS — F17210 Nicotine dependence, cigarettes, uncomplicated: Secondary | ICD-10-CM | POA: Insufficient documentation

## 2017-12-10 DIAGNOSIS — Z79899 Other long term (current) drug therapy: Secondary | ICD-10-CM | POA: Insufficient documentation

## 2017-12-10 DIAGNOSIS — R1012 Left upper quadrant pain: Secondary | ICD-10-CM | POA: Insufficient documentation

## 2017-12-10 LAB — URINALYSIS, ROUTINE W REFLEX MICROSCOPIC
Bilirubin Urine: NEGATIVE
GLUCOSE, UA: NEGATIVE mg/dL
Ketones, ur: NEGATIVE mg/dL
NITRITE: NEGATIVE
PH: 7 (ref 5.0–8.0)
PROTEIN: NEGATIVE mg/dL
Specific Gravity, Urine: 1.014 (ref 1.005–1.030)

## 2017-12-10 LAB — CBC
HEMATOCRIT: 37.6 % (ref 36.0–46.0)
HEMOGLOBIN: 12.5 g/dL (ref 12.0–15.0)
MCH: 25 pg — ABNORMAL LOW (ref 26.0–34.0)
MCHC: 33.2 g/dL (ref 30.0–36.0)
MCV: 75 fL — ABNORMAL LOW (ref 80.0–100.0)
NRBC: 0 % (ref 0.0–0.2)
Platelets: 232 10*3/uL (ref 150–400)
RBC: 5.01 MIL/uL (ref 3.87–5.11)
RDW: 13.9 % (ref 11.5–15.5)
WBC: 10.5 10*3/uL (ref 4.0–10.5)

## 2017-12-10 LAB — COMPREHENSIVE METABOLIC PANEL
ALT: 11 U/L (ref 0–44)
AST: 16 U/L (ref 15–41)
Albumin: 3.6 g/dL (ref 3.5–5.0)
Alkaline Phosphatase: 62 U/L (ref 38–126)
Anion gap: 7 (ref 5–15)
BUN: 8 mg/dL (ref 6–20)
CHLORIDE: 108 mmol/L (ref 98–111)
CO2: 24 mmol/L (ref 22–32)
Calcium: 9.1 mg/dL (ref 8.9–10.3)
Creatinine, Ser: 0.59 mg/dL (ref 0.44–1.00)
Glucose, Bld: 105 mg/dL — ABNORMAL HIGH (ref 70–99)
POTASSIUM: 3.2 mmol/L — AB (ref 3.5–5.1)
Sodium: 139 mmol/L (ref 135–145)
Total Bilirubin: 0.3 mg/dL (ref 0.3–1.2)
Total Protein: 6.4 g/dL — ABNORMAL LOW (ref 6.5–8.1)

## 2017-12-10 LAB — LIPASE, BLOOD: LIPASE: 30 U/L (ref 11–51)

## 2017-12-10 LAB — I-STAT BETA HCG BLOOD, ED (MC, WL, AP ONLY): I-stat hCG, quantitative: <5 m[IU]/mL (ref <5)

## 2017-12-10 NOTE — ED Triage Notes (Signed)
Pt to ED for evaluation of stomach pain x2 days. Pt states it hurts under her left rib cage. Denies N/V. Last BM today, not constipated.

## 2017-12-10 NOTE — ED Notes (Signed)
Sent urine culture to lab with UA

## 2017-12-11 ENCOUNTER — Emergency Department (HOSPITAL_COMMUNITY): Payer: Medicaid Other

## 2017-12-11 MED ORDER — ALUM & MAG HYDROXIDE-SIMETH 200-200-20 MG/5ML PO SUSP
30.0000 mL | Freq: Once | ORAL | Status: AC
  Administered 2017-12-11: 30 mL via ORAL
  Filled 2017-12-11: qty 30

## 2017-12-11 MED ORDER — SIMETHICONE 80 MG PO CHEW
160.0000 mg | CHEWABLE_TABLET | Freq: Once | ORAL | Status: AC
  Administered 2017-12-11: 160 mg via ORAL
  Filled 2017-12-11: qty 2

## 2017-12-11 MED ORDER — SIMETHICONE 80 MG PO CHEW: 80.0000 mg | CHEWABLE_TABLET | Freq: Three times a day (TID) | ORAL | 0 refills | Status: DC | PRN

## 2017-12-11 NOTE — ED Notes (Signed)
Reviewed d/c instructions with pt, who verbalized understanding and had no outstanding questions. Pt departed in NAD, refused use of wheelchair.   

## 2017-12-11 NOTE — ED Provider Notes (Signed)
[facility] EMERGENCY DEPARTMENT Provider Note   CSN: [id number] Arrival date & time: 12/10/17  2204     History   Chief Complaint Chief Complaint  Patient presents with  . Abdominal Pain    HPI Mackenzie Santiago is a 22 y.o. female.  The history is provided by the patient and medical records.  Abdominal Pain       22 y.o. F with hx of thyroid disease, heart murmur, presenting to the ED for abdominal pain.  Patient reports this has been ongoing for a few days now.  Pain mostly left upper abdomen, sometimes into her ribs it feels like.  No nausea, vomiting, diarrhea.  States she does not feel bloated/distended.  No fever, chills.  No cough, chest pain, or SOB.  Denies pelvic pain or vaginal discharge.  She is currently on her menstrual cycle, states she does not usually have bloating or fluid retention from that.  She does report BM yesterday that was normal, has not been passing a lot of gas. She does drink a lot of soda.  Denies any heartburn symptoms.  She has talked to her mom about this, told her it was likely gas pain.  She has not tried any medications for her symptoms.  Past Medical History:  Diagnosis Date  . Heart murmur   . Thyroid disease   . Trichimoniasis     Patient Active Problem List   Diagnosis Date Noted  . Obstetric labial laceration, delivered, current hospitalization 11/23/2015  . SVD (spontaneous vaginal delivery) 11/22/2015  . H/O anxiety state 11/22/2015  . Cigarette smoker 11/22/2015  . H/O sickle cell trait (FOB neg) 11/22/2015  . H/O chlamydia infection (neg TOC this pregnancy) 11/22/2015  . Group B streptococcal bacteriuria 11/22/2015  . H/O: depression 11/21/2015    Past Surgical History:  Procedure Laterality Date  . NO PAST SURGERIES       OB History    Gravida  1   Para  1   Term  1   Preterm      AB      Living  1     SAB      TAB      Ectopic      Multiple  0   Live Births  1             Home Medications    Prior to Admission medications   Medication Sig Start Date End Date Taking? Authorizing Provider  fexofenadine-pseudoephedrine (ALLEGRA-D) 60-120 MG 12 hr tablet Take 1 tablet by mouth 2 (two) times daily. 12/10/16   Raeford Razor, MD  fluticasone (FLONASE) 50 MCG/ACT nasal spray Place 2 sprays into both nostrils daily for 7 days. 12/10/16 12/17/16  Raeford Razor, MD  norgestimate-ethinyl estradiol (ORTHO-CYCLEN,SPRINTEC,PREVIFEM) 0.25-35 MG-MCG tablet Take 1 tablet by mouth daily. Take 2 tablets each day until bleeding stops then 1 tablet daily 07/12/16   Donette Larry, CNM    Family History Family History  Problem Relation Age of Onset  . Sickle cell anemia Father   . Sickle cell anemia Maternal Grandfather   . Thyroid disease Paternal Grandmother   . Sickle cell anemia Paternal Grandfather   . Hypertension Mother   . Alcohol abuse Neg Hx   . Arthritis Neg Hx   . Asthma Neg Hx   . Birth defects Neg Hx   . Cancer Neg Hx   . COPD Neg Hx   . Depression Neg Hx   . Diabetes Neg Hx   .  Drug abuse Neg Hx   . Early death Neg Hx   . Hearing loss Neg Hx   . Heart disease Neg Hx   . Hyperlipidemia Neg Hx   . Kidney disease Neg Hx   . Learning disabilities Neg Hx   . Mental illness Neg Hx   . Mental retardation Neg Hx   . Miscarriages / Stillbirths Neg Hx   . Stroke Neg Hx   . Vision loss Neg Hx   . Varicose Veins Neg Hx     Social History Social History   Tobacco Use  . Smoking status: Current Every Day Smoker    Packs/day: 0.25    Types: Cigarettes  . Smokeless tobacco: Current User  Substance Use Topics  . Alcohol use: No    Comment: social  . Drug use: No     Allergies   Blueberry [vaccinium angustifolium]   Review of Systems Review of Systems  Gastrointestinal: Positive for abdominal pain.  All other systems reviewed and are negative.    Physical Exam Updated Vital Signs BP (!) 127/104   Pulse 73   Temp 98.8 F (37.1 C)  (Oral)   Resp 16   Ht 5\' 1"  (1.549 m)   Wt 49.9 kg   LMP 12/07/2017 (Exact Date)   SpO2 100%   BMI 20.78 kg/m   Physical Exam  Constitutional: She is oriented to person, place, and time. She appears well-developed and well-nourished.  HENT:  Head: Normocephalic and atraumatic.  Mouth/Throat: Oropharynx is clear and moist.  Eyes: Pupils are equal, round, and reactive to light. Conjunctivae and EOM are normal.  Neck: Normal range of motion.  Cardiovascular: Normal rate, regular rhythm and normal heart sounds.  Pulmonary/Chest: Effort normal and breath sounds normal.  Abdominal: Soft. Bowel sounds are normal. There is no tenderness. There is no rigidity and no guarding.  Points to LUQ as area of pain but no focal tenderness or peritoneal signs  Musculoskeletal: Normal range of motion.  Neurological: She is alert and oriented to person, place, and time.  Skin: Skin is warm and dry.  Psychiatric: She has a normal mood and affect.  Nursing note and vitals reviewed.    ED Treatments / Results  Labs (all labs ordered are listed, but only abnormal results are displayed) Labs Reviewed  COMPREHENSIVE METABOLIC PANEL - Abnormal; Notable for the following components:      Result Value   Potassium 3.2 (*)    Glucose, Bld 105 (*)    Total Protein 6.4 (*)    All other components within normal limits  CBC - Abnormal; Notable for the following components:   MCV 75.0 (*)    MCH 25.0 (*)    All other components within normal limits  URINALYSIS, ROUTINE W REFLEX MICROSCOPIC - Abnormal; Notable for the following components:   APPearance CLOUDY (*)    Hgb urine dipstick MODERATE (*)    Leukocytes, UA TRACE (*)    Bacteria, UA RARE (*)    All other components within normal limits  LIPASE, BLOOD  I-STAT BETA HCG BLOOD, ED ([facility], [facility], [facility] ONLY)    EKG None  Radiology Dg Abd Acute W/chest  Result Date: 12/11/2017 CLINICAL DATA:  Left-sided abdominal pain for 2 days. EXAM: DG ABDOMEN ACUTE  W/ 1V CHEST COMPARISON:  None. FINDINGS: There is no evidence of dilated bowel loops or free intraperitoneal air. No radiopaque calculi or other significant radiographic abnormality is seen. Several phleboliths noted in the left pelvis. Heart size  and mediastinal contours are within normal limits. Both lungs are clear. IMPRESSION: Normal bowel gas pattern.  No acute findings. No active cardiopulmonary disease. Electronically Signed   By: Myles Rosenthal M.D.   On: 12/11/2017 01:26    Procedures Procedures (including critical care time)  Medications Ordered in ED Medications  simethicone (MYLICON) chewable tablet 160 mg (160 mg Oral Given 12/11/17 0158)  alum & mag hydroxide-simeth (MAALOX/MYLANTA) 200-200-20 MG/5ML suspension 30 mL (30 mLs Oral Given 12/11/17 0145)     Initial Impression / Assessment and Plan / ED Course  I have reviewed the triage vital signs and the nursing notes.  Pertinent labs & imaging results that were available during my care of the patient were reviewed by me and considered in my medical decision making (see chart for details).  22 y.o. F here with left upper abdominal pain for the past few days.  Currently on her cycle.  Denies N/V/D.  Does report drinking lots of sodas.  Denies any GERD symptoms.  She is afebrile, non-toxic.  Abdomen soft, non-tender.  Labs reassuring.  Acute abd series without any acute findings. Suspect this is likely gas pains.  Given dose of maalox as well as gas-x.    2:28 AM Feeling better after medications here.  She has eaten snack and drank orange juice without issue.  Stable for discharge.  Recommended to try to cut back on carbonated beverages such as soda, try to increase water intake.  Can continue gas-x at home.  Follow-up with PCP.  Return here for any new/acute changes.  Final Clinical Impressions(s) / ED Diagnoses   Final diagnoses:  Left upper quadrant pain    ED Discharge Orders         Ordered    simethicone (GAS-X) 80 MG  chewable tablet  Every 8 hours PRN     12/11/17 0229           Garlon Hatchet, PA-C 12/11/17 0402    Dione Booze, MD 12/11/17 [phone]

## 2017-12-11 NOTE — Discharge Instructions (Signed)
Take the prescribed medication as directed.  Try to cut back on your carbonated drinks (like soda) to see if this helps. Follow-up with your primary care doctor. Return to the ED for new or worsening symptoms.

## 2017-12-11 NOTE — ED Notes (Signed)
Patient transported to X-ray 

## 2018-03-16 IMAGING — US US MFM FETAL BPP W/O NON-STRESS
1 series · 9 of 9 positions shown · non-contrast
Comparison: none

[Series 1: us mfm fetal bpp w/o non-stress · 9 acquisitions, 9 frames shown]
[im 1/9]
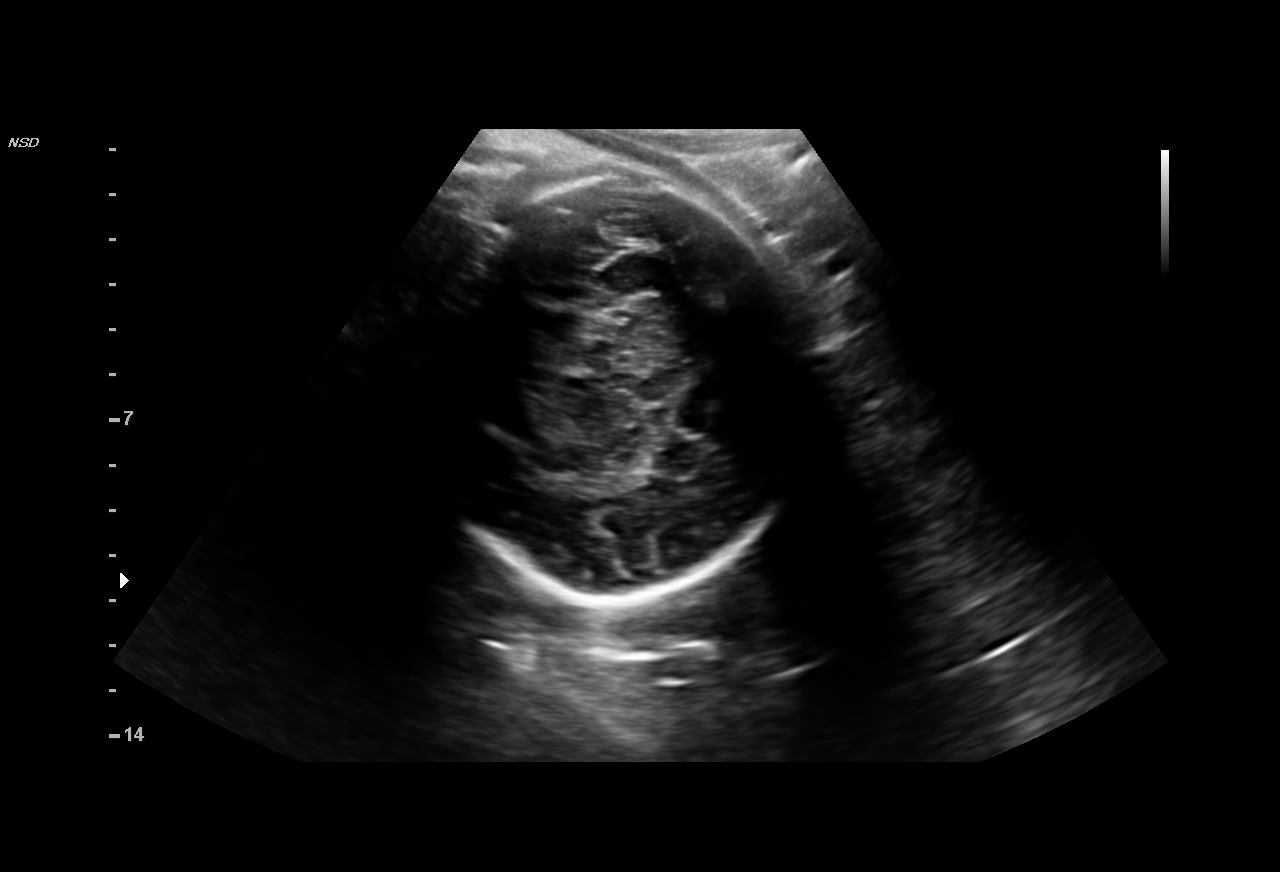
[im 2/9]
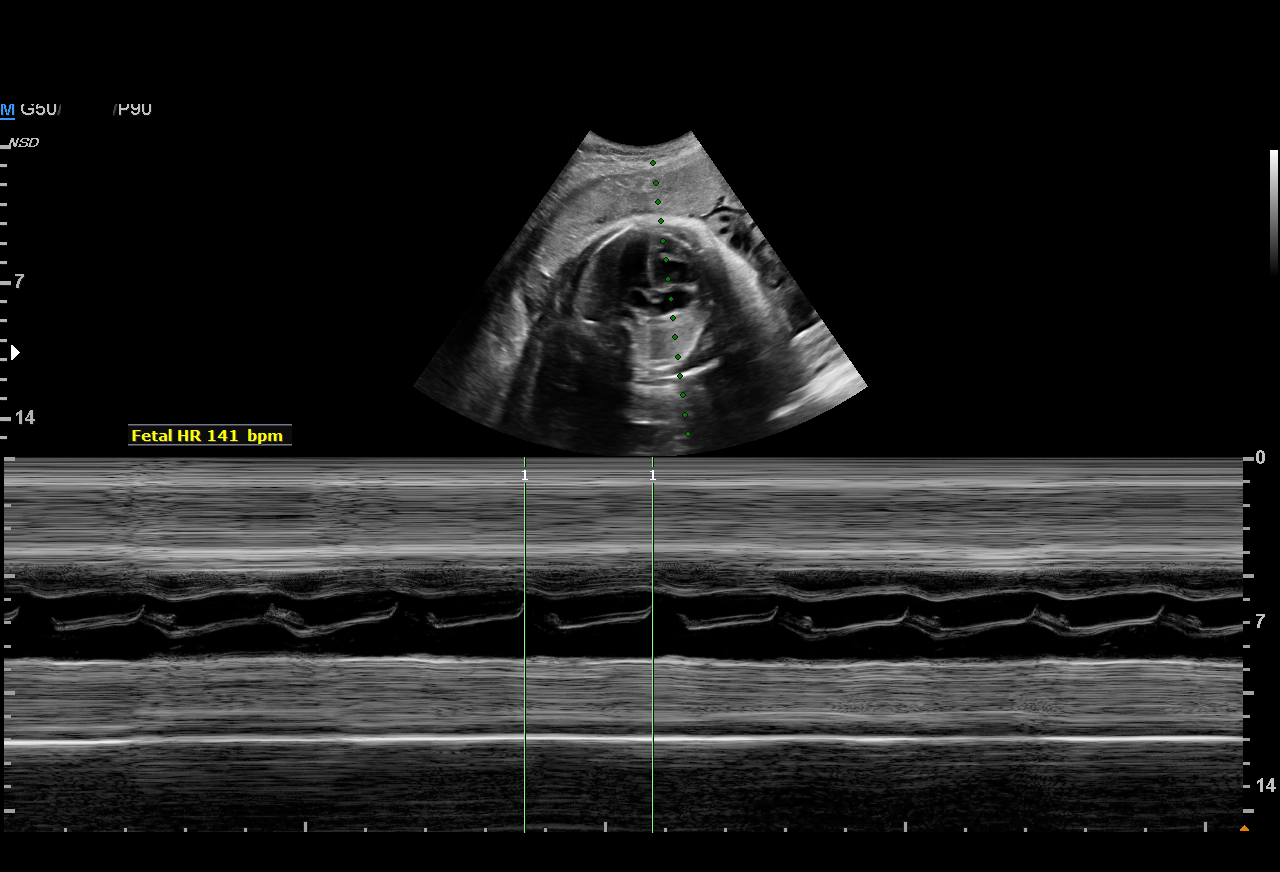
[im 3/9]
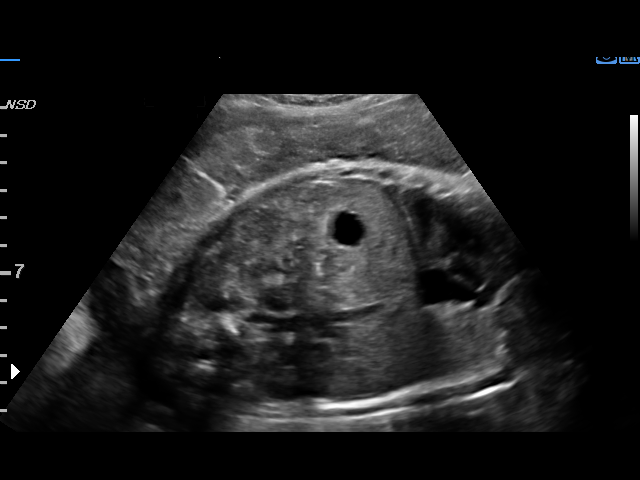
[im 4/9]
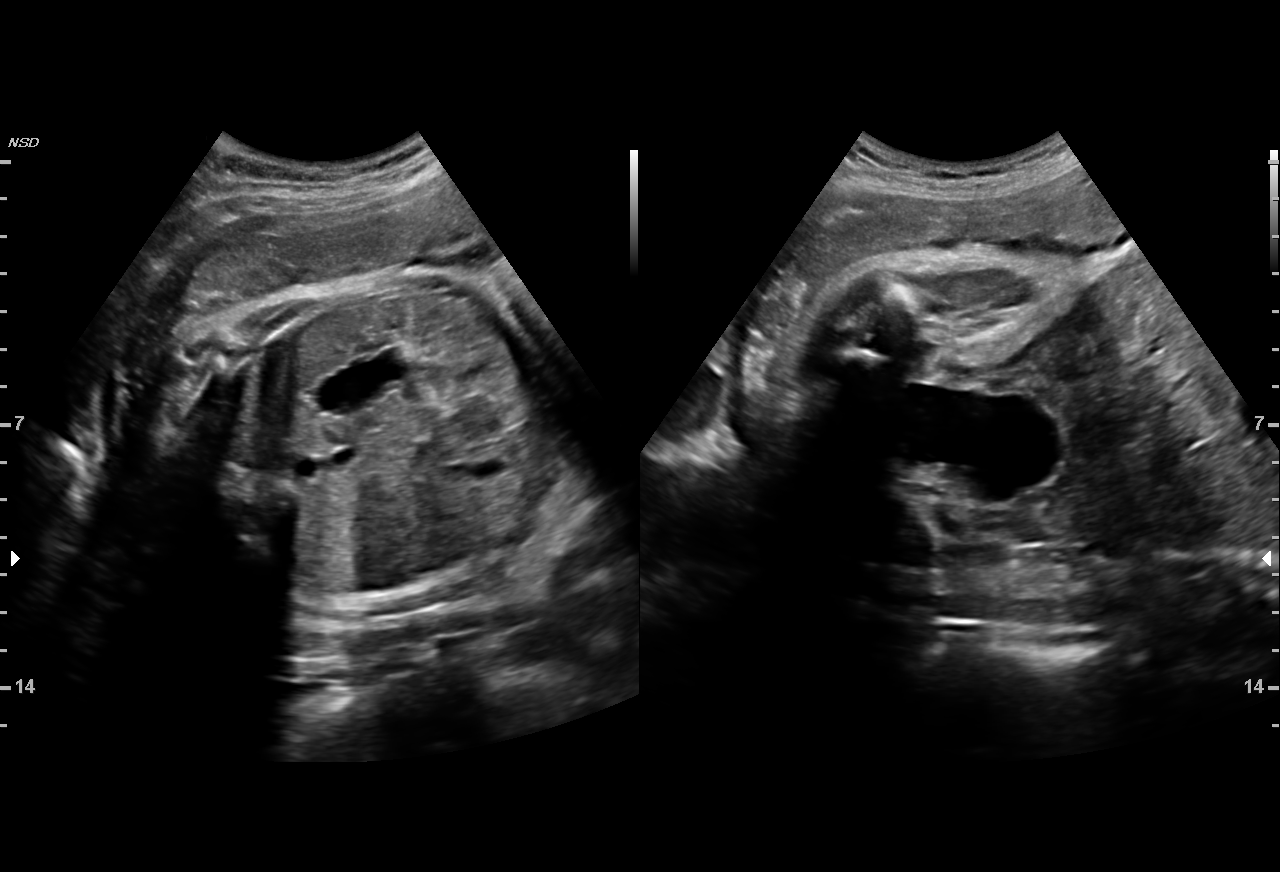
[im 5/9]
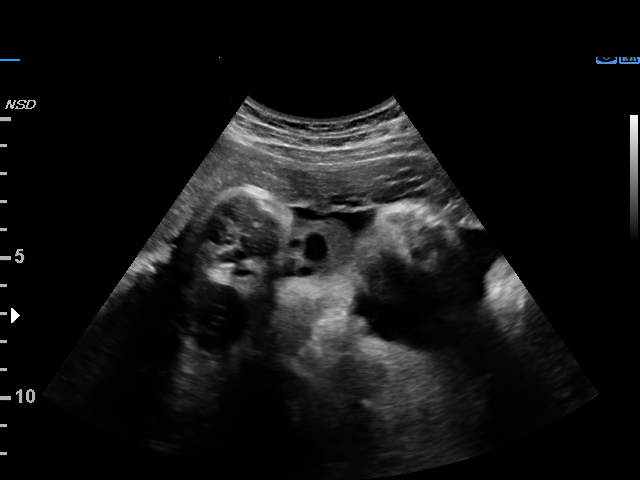
[im 6/9]
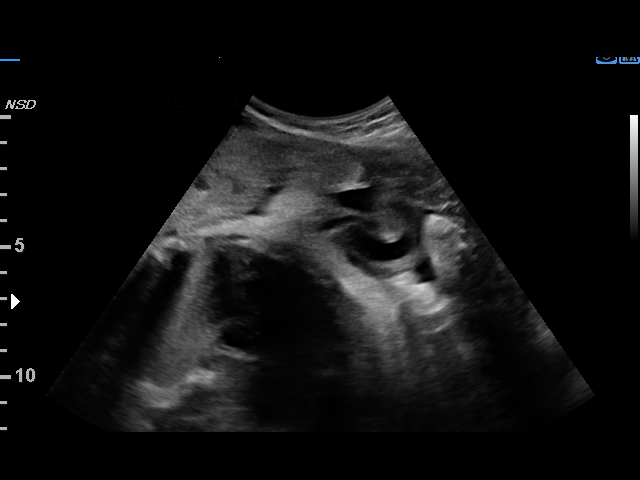
[im 7/9]
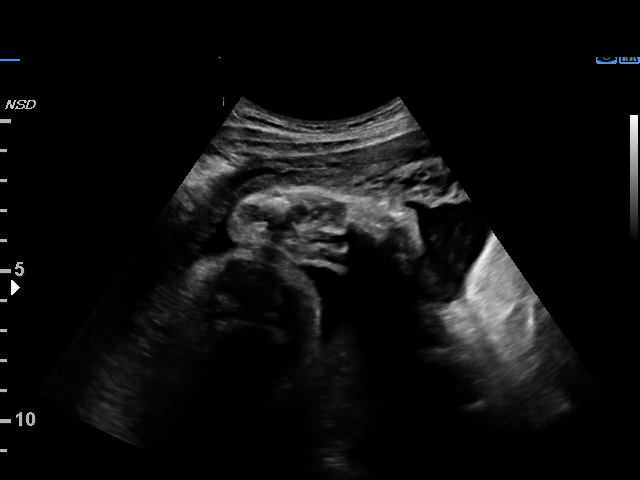
[im 8/9]
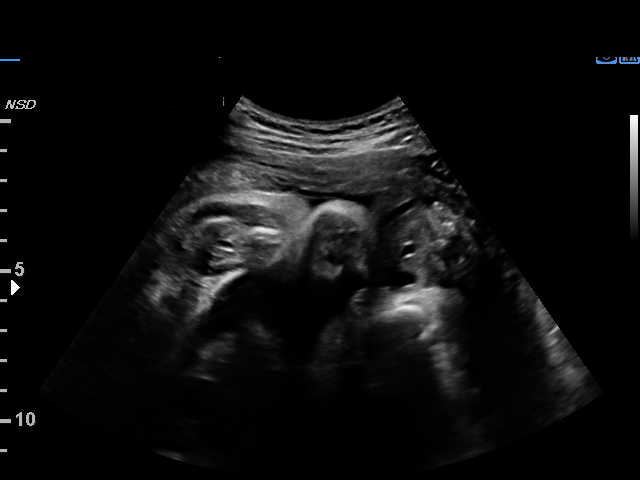
[im 9/9]
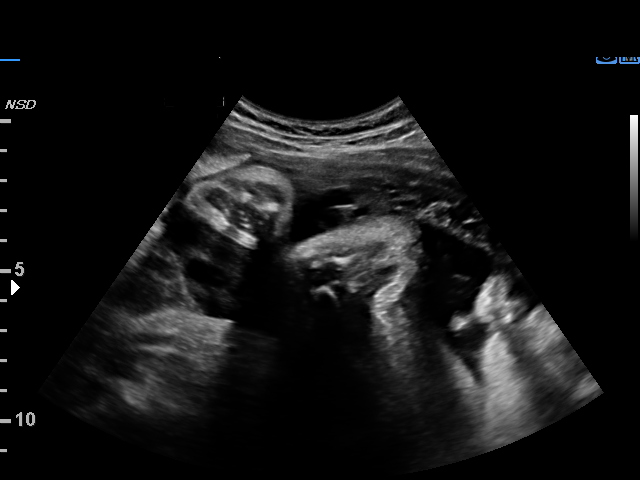

[9 of 9 positions shown; findings below may reference images not displayed]

Obstetrics &
Gynecology
[address]
[place]
MAU/Triage

1  DANII AUJLA           [id number]      [id number]     [id number]
Indications

37 weeks gestation of pregnancy
Possible Premature rupture of membranes -
leaking fluid
Smoking complicating pregnancy, third
trimester
OB History

Blood Type:            Height:  5'1"   Weight (lb):  104      BMI:
Gravidity:    1
Fetal Evaluation

Num Of Fetuses:     1
Fetal Heart         141
Rate(bpm):
Cardiac Activity:   Observed
Presentation:       Cephalic

Amniotic Fluid
AFI FV:      Subjectively within normal limits

AFI Sum(cm)     %Tile       Largest Pocket(cm)
9.97            25

RUQ(cm)       RLQ(cm)       LUQ(cm)        LLQ(cm)
2.56
Biophysical Evaluation

Amniotic F.V:   Pocket => 2 cm two         F. Tone:        Observed
planes
F. Movement:    Observed                   Score:          [DATE]
F. Breathing:   Observed
Gestational Age

LMP:           37w 5d       Date:   02/18/15                 EDD:   11/25/15
Best:          37w 5d    Det. By:   LMP  (02/18/15)          EDD:   11/25/15
Impression

IUP at 37+5 weeks with complaints of leaking fluid per vagina
AFI normal at 10cm
BPP [DATE]
Recommendations

No evidence fetal compromise. Contnue with clincial
evaluation

## 2018-07-14 ENCOUNTER — Emergency Department (HOSPITAL_COMMUNITY)
Admission: EM | Admit: 2018-07-14 | Discharge: 2018-07-14 | Discharge status: Against Medical Advice | Payer: Self-pay | Attending: Emergency Medicine | Admitting: Emergency Medicine

## 2018-07-14 ENCOUNTER — Other Ambulatory Visit: Payer: Self-pay

## 2018-07-14 ENCOUNTER — Encounter (HOSPITAL_COMMUNITY): Payer: Self-pay

## 2018-07-14 DIAGNOSIS — Z5321 Procedure and treatment not carried out due to patient leaving prior to being seen by health care provider: Secondary | ICD-10-CM

## 2018-07-14 LAB — POC URINE PREG, ED: Preg Test, Ur: NEGATIVE

## 2018-07-14 NOTE — ED Triage Notes (Signed)
Pt reports three days of vaginal bleeding, took a home pregnancy test that was positive but todays pregnancy test was negative. Pt denies any abd pain n/v.

## 2018-07-14 NOTE — ED Notes (Signed)
Pt stated she had to go. PA Sophia made aware.

## 2018-07-14 NOTE — ED Provider Notes (Signed)
Pt left prior to my evaluation. I was made aware pt had left after she was gone from the department    Franchot Heidelberg, PA-C 07/14/18 1604    Hayden Rasmussen, MD 07/15/18 [phone]

## 2018-08-26 ENCOUNTER — Encounter (HOSPITAL_COMMUNITY): Payer: Self-pay | Admitting: Emergency Medicine

## 2018-08-26 ENCOUNTER — Emergency Department (HOSPITAL_COMMUNITY)
Admission: EM | Admit: 2018-08-26 | Discharge: 2018-08-26 | Disposition: A | Discharge status: Home / Self Care | Payer: Self-pay | Attending: Emergency Medicine | Admitting: Emergency Medicine

## 2018-08-26 DIAGNOSIS — F17210 Nicotine dependence, cigarettes, uncomplicated: Secondary | ICD-10-CM | POA: Insufficient documentation

## 2018-08-26 DIAGNOSIS — L0231 Cutaneous abscess of buttock: Secondary | ICD-10-CM | POA: Insufficient documentation

## 2018-08-26 DIAGNOSIS — F17228 Nicotine dependence, chewing tobacco, with other nicotine-induced disorders: Secondary | ICD-10-CM | POA: Insufficient documentation

## 2018-08-26 DIAGNOSIS — Z79899 Other long term (current) drug therapy: Secondary | ICD-10-CM | POA: Insufficient documentation

## 2018-08-26 MED ORDER — DOXYCYCLINE HYCLATE 100 MG PO CAPS: 100.0000 mg | ORAL_CAPSULE | Freq: Two times a day (BID) | ORAL | 0 refills | Status: DC

## 2018-08-26 MED ORDER — LIDOCAINE-EPINEPHRINE (PF) 2 %-1:200000 IJ SOLN
10.0000 mL | Freq: Once | INTRAMUSCULAR | Status: AC
  Administered 2018-08-26: 10 mL
  Filled 2018-08-26: qty 10

## 2018-08-26 NOTE — ED Provider Notes (Signed)
[facility] DEPT Provider Note   CSN: [id number] Arrival date & time: 08/26/18  0431     History   Chief Complaint Chief Complaint  Patient presents with  . Abscess    HPI Mackenzie Santiago is a 23 y.o. female presenting for evaluation of abscess.  Patient states of the past 2 days, she has had a spot that has been growing larger and more painful on of her right butt cheek.  It began as a small itchy area that she scratched and broke the skin.  Since then, it has grown.  She denies drainage from the area.  Denies fevers, chills, nausea, vomiting, dental pain.  Pain does not extend towards her rectum, no pain with bowel movements.  She has no medical problems, takes no medications daily.  No history of previous abscesses.  She took ibuprofen yesterday and today with no improvement of her symptoms.  She is not on blood thinners.     HPI  Past Medical History:  Diagnosis Date  . Heart murmur   . Thyroid disease   . Trichimoniasis     Patient Active Problem List   Diagnosis Date Noted  . Obstetric labial laceration, delivered, current hospitalization 11/23/2015  . SVD (spontaneous vaginal delivery) 11/22/2015  . H/O anxiety state 11/22/2015  . Cigarette smoker 11/22/2015  . H/O sickle cell trait (FOB neg) 11/22/2015  . H/O chlamydia infection (neg TOC this pregnancy) 11/22/2015  . Group B streptococcal bacteriuria 11/22/2015  . H/O: depression 11/21/2015    Past Surgical History:  Procedure Laterality Date  . NO PAST SURGERIES       OB History    Gravida  1   Para  1   Term  1   Preterm      AB      Living  1     SAB      TAB      Ectopic      Multiple  0   Live Births  1            Home Medications    Prior to Admission medications   Medication Sig Start Date End Date Taking? Authorizing Provider  BIOTIN PO Take 1 tablet by mouth daily.   Yes [provider]  Multiple Vitamin (MULTIVITAMIN WITH  MINERALS) TABS tablet Take 1 tablet by mouth daily.   Yes [provider]  OVER THE COUNTER MEDICATION Take 1 capsule by mouth daily. Seamoss   Yes [provider]  vitamin B-12 (CYANOCOBALAMIN) 500 MCG tablet Take 500 mcg by mouth daily.   Yes [provider]  doxycycline (VIBRAMYCIN) 100 MG capsule Take 1 capsule (100 mg total) by mouth 2 (two) times daily. 08/26/18   Shayda Kalka, PA-C  fexofenadine-pseudoephedrine (ALLEGRA-D) 60-120 MG 12 hr tablet Take 1 tablet by mouth 2 (two) times daily. Patient not taking: Reported on 08/26/2018 12/10/16   Virgel Manifold, MD  fluticasone [facility]) 50 MCG/ACT nasal spray Place 2 sprays into both nostrils daily for 7 days. Patient not taking: Reported on 08/26/2018 12/10/16 12/17/16  Virgel Manifold, MD  norgestimate-ethinyl estradiol (ORTHO-CYCLEN,SPRINTEC,PREVIFEM) 0.25-35 MG-MCG tablet Take 1 tablet by mouth daily. Take 2 tablets each day until bleeding stops then 1 tablet daily Patient not taking: Reported on 08/26/2018 07/12/16   Julianne Handler, CNM  simethicone (GAS-X) 80 MG chewable tablet Chew 1-2 tablets (80-160 mg total) by mouth every 8 (eight) hours as needed for flatulence. Patient not taking: Reported on 08/26/2018 12/11/17  Garlon HatchetSanders, Lisa M, PA-C    Family History Family History  Problem Relation Age of Onset  . Sickle cell anemia Father   . Sickle cell anemia Maternal Grandfather   . Thyroid disease Paternal Grandmother   . Sickle cell anemia Paternal Grandfather   . Hypertension Mother   . Alcohol abuse Neg Hx   . Arthritis Neg Hx   . Asthma Neg Hx   . Birth defects Neg Hx   . Cancer Neg Hx   . COPD Neg Hx   . Depression Neg Hx   . Diabetes Neg Hx   . Drug abuse Neg Hx   . Early death Neg Hx   . Hearing loss Neg Hx   . Heart disease Neg Hx   . Hyperlipidemia Neg Hx   . Kidney disease Neg Hx   . Learning disabilities Neg Hx   . Mental illness Neg Hx   . Mental retardation Neg Hx   . Miscarriages  / Stillbirths Neg Hx   . Stroke Neg Hx   . Vision loss Neg Hx   . Varicose Veins Neg Hx     Social History Social History   Tobacco Use  . Smoking status: Current Every Day Smoker    Packs/day: 0.25    Types: Cigarettes  . Smokeless tobacco: Current User  Substance Use Topics  . Alcohol use: No    Comment: social  . Drug use: No     Allergies   Blueberry [vaccinium angustifolium]   Review of Systems Review of Systems  Constitutional: Negative for fever.  Skin:       Abscess of right buttock     Physical Exam Updated Vital Signs BP 94/61   Pulse 88   Temp 99.1 F (37.3 C) (Oral)   Resp 15   Ht 5\' 1"  (1.549 m)   Wt 56.7 kg   SpO2 100%   BMI 23.62 kg/m   Physical Exam Vitals signs and nursing note reviewed. Exam conducted with a chaperone present.  Constitutional:      General: She is not in acute distress.    Appearance: She is well-developed.     Comments: Appears nontoxic  HENT:     Head: Normocephalic and atraumatic.  Neck:     Musculoskeletal: Normal range of motion.  Cardiovascular:     Rate and Rhythm: Normal rate and regular rhythm.     Pulses: Normal pulses.  Pulmonary:     Effort: Pulmonary effort is normal.     Breath sounds: Normal breath sounds.  Abdominal:     General: There is no distension.     Palpations: Abdomen is soft. There is no mass.     Tenderness: There is no abdominal tenderness. There is no guarding or rebound.  Genitourinary:      Comments: Area of induration, erythema, and tenderness.  Central scab without drainage.  Induration, erythema, and tenderness does not extend towards the rectum.  No crepitus. Musculoskeletal: Normal range of motion.  Skin:    General: Skin is warm.     Capillary Refill: Capillary refill takes less than 2 seconds.     Findings: No rash.  Neurological:     Mental Status: She is alert and oriented to person, place, and time.      ED Treatments / Results  Labs (all labs ordered are  listed, but only abnormal results are displayed) Labs Reviewed - No data to display  EKG None  Radiology No results found.  Procedures ..Marland Kitchen  Incision and Drainage  Date/Time: 08/26/2018 10:50 AM Performed by: Alveria Apleyaccavale, Myranda Pavone, PA-C Authorized by: Alveria Apleyaccavale, Jalasia Eskridge, PA-C   Consent:    Consent obtained:  Verbal   Consent given by:  Patient   Risks discussed:  Bleeding, damage to other organs, incomplete drainage, infection and pain Location:    Type:  Abscess   Location:  Anogenital   Anogenital location: Right buttock. Pre-procedure details:    Skin preparation:  Chloraprep Anesthesia (see MAR for exact dosages):    Anesthesia method:  Local infiltration   Local anesthetic:  Lidocaine 2% WITH epi Procedure type:    Complexity:  Simple Procedure details:    Incision types:  Single straight   Incision depth:  Dermal   Scalpel blade:  11   Wound management:  Probed and deloculated, irrigated with saline and extensive cleaning   Drainage:  Purulent and bloody   Drainage amount:  Moderate   Wound treatment:  Wound left open   Packing materials:  None Post-procedure details:    Patient tolerance of procedure:  Tolerated well, no immediate complications   (including critical care time)  Medications Ordered in ED Medications  lidocaine-EPINEPHrine (XYLOCAINE W/EPI) 2 %-1:200000 (PF) injection 10 mL (10 mLs Infiltration Given 08/26/18 0930)     Initial Impression / Assessment and Plan / ED Course  I have reviewed the triage vital signs and the nursing notes.  Pertinent labs & imaging results that were available during my care of the patient were reviewed by me and considered in my medical decision making (see chart for details).        Patient with abscess of the right buttock.  No active drainage.  No signs of systemic infection.  I&D performed as described above.  Purulent material expressed.  Irrigated extensively with saline.  As she has erythema and induration of the  surrounding skin, will discharge with antibiotics.  Discussed symptomatic treatment Tylenol, ibuprofen, warm compresses.  Encourage follow-up with symptoms not improving.  At this time, patient appears safe for discharge.  Return precautions given.  Patient states she understands and agrees to plan.  Final Clinical Impressions(s) / ED Diagnoses   Final diagnoses:  Abscess of buttock, right    ED Discharge Orders         Ordered    doxycycline (VIBRAMYCIN) 100 MG capsule  2 times daily     08/26/18 0924           Alveria ApleyCaccavale, Renaldo Gornick, PA-C 08/26/18 1052    Bethann BerkshireZammit, Joseph, MD 08/27/18 [phone]

## 2018-08-26 NOTE — ED Triage Notes (Signed)
Patient here from home complaining of abscess to left buttocks x2 days. Non draining.

## 2018-08-26 NOTE — Discharge Instructions (Addendum)
Take antibiotics as prescribed.  Take entire course, even if your symptoms improve. Use Tylenol and ibuprofen as needed for pain. Use warm compresses to help with pain and swelling. Wash daily with soap and water.  Reapply new dressing each day. Return to the emergency room if you develop high fevers, severe worsening pain, increased pus draining from the area, or any new, worsening, or concerning symptoms.

## 2019-09-15 ENCOUNTER — Other Ambulatory Visit: Payer: Self-pay

## 2019-09-15 ENCOUNTER — Inpatient Hospital Stay (HOSPITAL_COMMUNITY)
Admission: AD | Admit: 2019-09-15 | Discharge: 2019-09-15 | Disposition: A | Discharge status: Home / Self Care | Payer: Self-pay | Attending: Obstetrics and Gynecology | Admitting: Obstetrics and Gynecology

## 2019-09-15 DIAGNOSIS — N898 Other specified noninflammatory disorders of vagina: Secondary | ICD-10-CM

## 2019-09-15 DIAGNOSIS — R109 Unspecified abdominal pain: Secondary | ICD-10-CM | POA: Insufficient documentation

## 2019-09-15 DIAGNOSIS — R208 Other disturbances of skin sensation: Secondary | ICD-10-CM | POA: Insufficient documentation

## 2019-09-15 DIAGNOSIS — R1030 Lower abdominal pain, unspecified: Secondary | ICD-10-CM

## 2019-09-15 DIAGNOSIS — M545 Low back pain: Secondary | ICD-10-CM

## 2019-09-15 DIAGNOSIS — Z3202 Encounter for pregnancy test, result negative: Secondary | ICD-10-CM | POA: Insufficient documentation

## 2019-09-15 DIAGNOSIS — M549 Dorsalgia, unspecified: Secondary | ICD-10-CM | POA: Insufficient documentation

## 2019-09-15 LAB — POCT PREGNANCY, URINE: Preg Test, Ur: NEGATIVE

## 2019-09-15 NOTE — MAU Note (Signed)
No period this month.  Neg home test last week. Has been having some abd and back pain for 2 wks. "boobs feel like they got carpet burn".  Been having like weird d/c.

## 2019-09-15 NOTE — MAU Provider Note (Signed)
First Provider Initiated Contact with Patient 09/15/19 1717      S [name] is a 24 y.o. G25P1001 non-pregnant female who presents to MAU today with complaint of low back pain, lower abdominal pain, abnormal vaginal discharge and late period. Patient reports she took a pregnancy test at home last week and it was negative. UPT in MAU today negative as well.  O BP 110/80 (BP Location: Right Arm)   Pulse 94   Temp 99.3 F (37.4 C) (Oral)   Resp 16   Ht 5\' 1"  (1.549 m)   Wt 51 kg   LMP 08/04/2019   SpO2 100%   BMI 21.26 kg/m    Patient Vitals for the past 24 hrs:  BP Temp Temp src Pulse Resp SpO2 Height Weight  09/15/19 1705 110/80 99.3 F (37.4 C) Oral 94 16 100 % 5\' 1"  (1.549 m) 51 kg   Physical Exam Vitals and nursing note reviewed.  Constitutional:      General: She is not in acute distress.    Appearance: Normal appearance. She is normal weight. She is not ill-appearing, toxic-appearing or diaphoretic.  HENT:     Head: Normocephalic and atraumatic.  Pulmonary:     Effort: Pulmonary effort is normal.  Neurological:     Mental Status: She is alert and oriented to person, place, and time.  Psychiatric:        Mood and Affect: Mood normal.        Behavior: Behavior normal.        Thought Content: Thought content normal.        Judgment: Judgment normal.    A Non pregnant female Medical screening exam complete UPT negative  P Discharge from MAU in stable condition Patient given the option of transfer to [facility] for further evaluation or seek care in outpatient facility of choice List of options for follow-up given  Warning signs for worsening condition that would warrant emergency follow-up discussed Patient may return to MAU as needed for pregnancy related complaints  Adrinne Sze, , NP 09/15/2019 5:18 PM

## 2019-09-23 ENCOUNTER — Encounter (HOSPITAL_COMMUNITY): Payer: Self-pay

## 2019-09-23 ENCOUNTER — Ambulatory Visit (HOSPITAL_COMMUNITY)
Admission: EM | Admit: 2019-09-23 | Discharge: 2019-09-23 | Disposition: A | Discharge status: Home / Self Care | Payer: Self-pay | Attending: Family Medicine | Admitting: Family Medicine

## 2019-09-23 DIAGNOSIS — Z3202 Encounter for pregnancy test, result negative: Secondary | ICD-10-CM

## 2019-09-23 DIAGNOSIS — R42 Dizziness and giddiness: Secondary | ICD-10-CM

## 2019-09-23 DIAGNOSIS — H6690 Otitis media, unspecified, unspecified ear: Secondary | ICD-10-CM

## 2019-09-23 HISTORY — DX: Thyrotoxicosis, unspecified without thyrotoxic crisis or storm: E05.90

## 2019-09-23 LAB — POCT URINALYSIS DIPSTICK, ED / UC
Bilirubin Urine: NEGATIVE
Glucose, UA: 100 mg/dL — AB
Leukocytes,Ua: NEGATIVE
Nitrite: NEGATIVE
Protein, ur: NEGATIVE mg/dL
Specific Gravity, Urine: 1.02 (ref 1.005–1.030)
Urobilinogen, UA: 0.2 mg/dL (ref 0.0–1.0)
pH: 5.5 (ref 5.0–8.0)

## 2019-09-23 LAB — CBG MONITORING, ED: Glucose-Capillary: 68 mg/dL — ABNORMAL LOW (ref 70–99)

## 2019-09-23 LAB — POC URINE PREG, ED: Preg Test, Ur: NEGATIVE

## 2019-09-23 MED ORDER — MECLIZINE HCL 12.5 MG PO TABS: 12.5000 mg | ORAL_TABLET | Freq: Three times a day (TID) | ORAL | 0 refills | Status: DC | PRN

## 2019-09-23 NOTE — ED Triage Notes (Signed)
Pt c/o dizziness, lightheadedness, fatigue, nausea for approx 3 weeks after having "virus or something" in which pt had non-specific fever, body aches. Pt states lightheadedness/dizziness is worse in morning and when making position changes. Also c/o left ear ringing since June. also c/o lower abdominal/pelvic discomfort and "sharp feeling'" when urinating occasionally. States she mainly drinks sodas, lack of appetite, "snacks" of chips, chocolate and candy as opposed to full meals and eats breakfast.  States missed her period in August, had "light" period that only lasted 2 days from 09/04-09/06.  Denies v/d, fever.

## 2019-09-23 NOTE — ED Provider Notes (Signed)
[facility]    CSN: [id number] Arrival date & time: 09/23/19  1721      History   Chief Complaint Chief Complaint  Patient presents with  . Dizziness    HPI Mackenzie Santiago is a 24 y.o. female.   Patient has multiple symptoms included lightheadedness fatigue and nausea for 3 weeks.  She missed.  In August does not think she is pregnant but admits that might be a possibility.  She also has some urinary symptoms.  HPI  Past Medical History:  Diagnosis Date  . Heart murmur   . Hyperthyroidism   . Thyroid disease   . Trichimoniasis     Patient Active Problem List   Diagnosis Date Noted  . Obstetric labial laceration, delivered, current hospitalization 11/23/2015  . SVD (spontaneous vaginal delivery) 11/22/2015  . H/O anxiety state 11/22/2015  . Cigarette smoker 11/22/2015  . H/O sickle cell trait (FOB neg) 11/22/2015  . H/O chlamydia infection (neg TOC this pregnancy) 11/22/2015  . Group B streptococcal bacteriuria 11/22/2015  . H/O: depression 11/21/2015    Past Surgical History:  Procedure Laterality Date  . NO PAST SURGERIES      OB History    Gravida  1   Para  1   Term  1   Preterm      AB      Living  1     SAB      TAB      Ectopic      Multiple  0   Live Births  1            Home Medications    Prior to Admission medications   Medication Sig Start Date End Date Taking? Authorizing Provider  BIOTIN PO Take 1 tablet by mouth daily.    [provider]  doxycycline (VIBRAMYCIN) 100 MG capsule Take 1 capsule (100 mg total) by mouth 2 (two) times daily. 08/26/18   Caccavale, Sophia, PA-C  fexofenadine-pseudoephedrine (ALLEGRA-D) 60-120 MG 12 hr tablet Take 1 tablet by mouth 2 (two) times daily. Patient not taking: Reported on 08/26/2018 12/10/16   Raeford Razor, MD  fluticasone [facility]) 50 MCG/ACT nasal spray Place 2 sprays into both nostrils daily for 7 days. Patient not taking: Reported on 08/26/2018 12/10/16  12/17/16  Raeford Razor, MD  Multiple Vitamin (MULTIVITAMIN WITH MINERALS) TABS tablet Take 1 tablet by mouth daily.    [provider]  norgestimate-ethinyl estradiol (ORTHO-CYCLEN,SPRINTEC,PREVIFEM) 0.25-35 MG-MCG tablet Take 1 tablet by mouth daily. Take 2 tablets each day until bleeding stops then 1 tablet daily Patient not taking: Reported on 08/26/2018 07/12/16   Donette Larry, CNM  OVER THE COUNTER MEDICATION Take 1 capsule by mouth daily. Seamoss    [provider]  simethicone (GAS-X) 80 MG chewable tablet Chew 1-2 tablets (80-160 mg total) by mouth every 8 (eight) hours as needed for flatulence. Patient not taking: Reported on 08/26/2018 12/11/17   Garlon Hatchet, PA-C  vitamin B-12 (CYANOCOBALAMIN) 500 MCG tablet Take 500 mcg by mouth daily.    [provider]    Family History Family History  Problem Relation Age of Onset  . Sickle cell anemia Father   . Sickle cell anemia Maternal Grandfather   . Thyroid disease Paternal Grandmother   . Sickle cell anemia Paternal Grandfather   . Hypertension Mother   . Alcohol abuse Neg Hx   . Arthritis Neg Hx   . Asthma Neg Hx   . Birth defects Neg Hx   .  Cancer Neg Hx   . COPD Neg Hx   . Depression Neg Hx   . Diabetes Neg Hx   . Drug abuse Neg Hx   . Early death Neg Hx   . Hearing loss Neg Hx   . Heart disease Neg Hx   . Hyperlipidemia Neg Hx   . Kidney disease Neg Hx   . Learning disabilities Neg Hx   . Mental illness Neg Hx   . Mental retardation Neg Hx   . Miscarriages / Stillbirths Neg Hx   . Stroke Neg Hx   . Vision loss Neg Hx   . Varicose Veins Neg Hx     Social History Social History   Tobacco Use  . Smoking status: Current Every Day Smoker    Packs/day: 0.25    Types: Cigarettes  . Smokeless tobacco: Current User  Substance Use Topics  . Alcohol use: No    Comment: social  . Drug use: No     Allergies   Blueberry [vaccinium angustifolium]   Review of Systems Review of  Systems  Constitutional: Positive for activity change.  Respiratory: Negative.   Gastrointestinal: Positive for nausea.  Genitourinary: Positive for dysuria.  Neurological: Positive for dizziness.  All other systems reviewed and are negative.    Physical Exam Triage Vital Signs ED Triage Vitals  Enc Vitals Group     BP 09/23/19 1847 131/84     Pulse Rate 09/23/19 1847 87     Resp 09/23/19 1847 20     Temp 09/23/19 1847 99.2 F (37.3 C)     Temp Source 09/23/19 1847 Oral     SpO2 09/23/19 1847 97 %     Weight --      Height --      Head Circumference --      Peak Flow --      Pain Score 09/23/19 1844 0     Pain Loc --      Pain Edu? --      Excl. in GC? --    No data found.  Updated Vital Signs BP 131/84 (BP Location: Right Arm)   Pulse 87   Temp 99.2 F (37.3 C) (Oral)   Resp 20   LMP 09/19/2019   SpO2 97%   Visual Acuity Right Eye Distance:   Left Eye Distance:   Bilateral Distance:    Right Eye Near:   Left Eye Near:    Bilateral Near:     Physical Exam Vitals and nursing note reviewed.  Constitutional:      Appearance: Normal appearance. She is normal weight.  HENT:     Head: Normocephalic.     Right Ear: Tympanic membrane normal.     Left Ear: Tympanic membrane normal.  Cardiovascular:     Rate and Rhythm: Normal rate and regular rhythm.  Pulmonary:     Effort: Pulmonary effort is normal.     Breath sounds: Normal breath sounds.  Neurological:     General: No focal deficit present.     Mental Status: She is alert and oriented to person, place, and time.      UC Treatments / Results  Labs (all labs ordered are listed, but only abnormal results are displayed) Labs Reviewed  POCT URINALYSIS DIPSTICK, ED / UC - Abnormal; Notable for the following components:      Result Value   Glucose, UA 100 (*)    Ketones, ur TRACE (*)    Hgb urine dipstick TRACE (*)  All other components within normal limits  POC URINE PREG, ED  CBG MONITORING, ED     EKG   Radiology No results found.  Procedures Procedures (including critical care time)  Medications Ordered in UC Medications - No data to display  Initial Impression / Assessment and Plan / UC Course  I have reviewed the triage vital signs and the nursing notes.  Pertinent labs & imaging results that were available during my care of the patient were reviewed by me and considered in my medical decision making (see chart for details).     Lightheadedness, probably related to inner ear infection.  Pregnancy test and urinalysis are both negative Final Clinical Impressions(s) / UC Diagnoses   Final diagnoses:  None   Discharge Instructions   None    ED Prescriptions    None     PDMP not reviewed this encounter.   Frederica Kuster, MD 09/23/19 [place]

## 2019-09-24 ENCOUNTER — Telehealth (HOSPITAL_COMMUNITY): Payer: Self-pay | Admitting: Family Medicine

## 2019-09-24 MED ORDER — NEOMYCIN-POLYMYXIN-HC 3.5-10000-1 OT SUSP: 3.0000 [drp] | Freq: Three times a day (TID) | OTIC | 0 refills | Status: AC

## 2019-09-24 NOTE — Telephone Encounter (Signed)
Patient called requesting medication for left otitis media.  Seen by provider and note mentions symptoms likely related to inner ear infection however, did not prescribed medication. Will prescribed polymyxin.

## 2019-12-01 ENCOUNTER — Ambulatory Visit (HOSPITAL_COMMUNITY)
Admission: EM | Admit: 2019-12-01 | Discharge: 2019-12-01 | Disposition: A | Discharge status: Home / Self Care | Payer: Self-pay | Attending: Urgent Care | Admitting: Urgent Care

## 2019-12-01 ENCOUNTER — Other Ambulatory Visit: Payer: Self-pay

## 2019-12-01 ENCOUNTER — Encounter (HOSPITAL_COMMUNITY): Payer: Self-pay | Admitting: Urgent Care

## 2019-12-01 DIAGNOSIS — K2900 Acute gastritis without bleeding: Secondary | ICD-10-CM

## 2019-12-01 DIAGNOSIS — R0789 Other chest pain: Secondary | ICD-10-CM

## 2019-12-01 DIAGNOSIS — R1110 Vomiting, unspecified: Secondary | ICD-10-CM

## 2019-12-01 DIAGNOSIS — S29011A Strain of muscle and tendon of front wall of thorax, initial encounter: Secondary | ICD-10-CM

## 2019-12-01 MED ORDER — FAMOTIDINE 20 MG PO TABS: 20.0000 mg | ORAL_TABLET | Freq: Two times a day (BID) | ORAL | 0 refills | Status: DC

## 2019-12-01 MED ORDER — TIZANIDINE HCL 4 MG PO TABS: 4.0000 mg | ORAL_TABLET | Freq: Three times a day (TID) | ORAL | 0 refills | Status: DC | PRN

## 2019-12-01 MED ORDER — MELOXICAM 7.5 MG PO TABS: 7.5000 mg | ORAL_TABLET | Freq: Every day | ORAL | 0 refills | Status: DC

## 2019-12-01 NOTE — ED Provider Notes (Signed)
[facility]   MRN: [id number] DOB: 1995-11-13  Subjective:   Mackenzie Santiago is a 24 y.o. female presenting for 2 to 3-day history of chest wall discomfort, positional muscle pain of her chest.  Patient states that she had at night where she went out in drink over the weekend.  She had a lot of alcohol and ended up having a lot of vomiting.  Denies fevers, difficulty with her breathing, body aches, loss of sense of taste and smell.  No current facility-administered medications for this encounter.  Current Outpatient Medications:  .  BIOTIN PO, Take 1 tablet by mouth daily., Disp: , Rfl:  .  doxycycline (VIBRAMYCIN) 100 MG capsule, Take 1 capsule (100 mg total) by mouth 2 (two) times daily., Disp: 20 capsule, Rfl: 0 .  fexofenadine-pseudoephedrine (ALLEGRA-D) 60-120 MG 12 hr tablet, Take 1 tablet by mouth 2 (two) times daily. (Patient not taking: Reported on 08/26/2018), Disp: 10 tablet, Rfl: 0 .  fluticasone (FLONASE) 50 MCG/ACT nasal spray, Place 2 sprays into both nostrils daily for 7 days. (Patient not taking: Reported on 08/26/2018), Disp: 16 g, Rfl: 0 .  meclizine (ANTIVERT) 12.5 MG tablet, Take 1 tablet (12.5 mg total) by mouth 3 (three) times daily as needed for dizziness., Disp: 30 tablet, Rfl: 0 .  Multiple Vitamin (MULTIVITAMIN WITH MINERALS) TABS tablet, Take 1 tablet by mouth daily., Disp: , Rfl:  .  norgestimate-ethinyl estradiol (ORTHO-CYCLEN,SPRINTEC,PREVIFEM) 0.25-35 MG-MCG tablet, Take 1 tablet by mouth daily. Take 2 tablets each day until bleeding stops then 1 tablet daily (Patient not taking: Reported on 08/26/2018), Disp: 3 Package, Rfl: 0 .  OVER THE COUNTER MEDICATION, Take 1 capsule by mouth daily. Seamoss, Disp: , Rfl:  .  simethicone (GAS-X) 80 MG chewable tablet, Chew 1-2 tablets (80-160 mg total) by mouth every 8 (eight) hours as needed for flatulence. (Patient not taking: Reported on 08/26/2018), Disp: 30 tablet, Rfl: 0 .  vitamin B-12  (CYANOCOBALAMIN) 500 MCG tablet, Take 500 mcg by mouth daily., Disp: , Rfl:    Allergies  Allergen Reactions  . Blueberry [Vaccinium Angustifolium] Hives         Past Medical History:  Diagnosis Date  . Heart murmur   . Hyperthyroidism   . Thyroid disease   . Trichimoniasis      Past Surgical History:  Procedure Laterality Date  . NO PAST SURGERIES      Family History  Problem Relation Age of Onset  . Sickle cell anemia Father   . Sickle cell anemia Maternal Grandfather   . Thyroid disease Paternal Grandmother   . Sickle cell anemia Paternal Grandfather   . Hypertension Mother   . Alcohol abuse Neg Hx   . Arthritis Neg Hx   . Asthma Neg Hx   . Birth defects Neg Hx   . Cancer Neg Hx   . COPD Neg Hx   . Depression Neg Hx   . Diabetes Neg Hx   . Drug abuse Neg Hx   . Early death Neg Hx   . Hearing loss Neg Hx   . Heart disease Neg Hx   . Hyperlipidemia Neg Hx   . Kidney disease Neg Hx   . Learning disabilities Neg Hx   . Mental illness Neg Hx   . Mental retardation Neg Hx   . Miscarriages / Stillbirths Neg Hx   . Stroke Neg Hx   . Vision loss Neg Hx   . Varicose Veins Neg Hx  Social History   Tobacco Use  . Smoking status: Current Every Day Smoker    Packs/day: 0.25    Types: Cigarettes  . Smokeless tobacco: Current User  Substance Use Topics  . Alcohol use: No    Comment: social  . Drug use: No    ROS   Objective:   Vitals: BP 113/71 (BP Location: Left Arm)   Pulse 72   Temp 98.8 F (37.1 C) (Oral)   Resp 18   LMP 11/20/2019   SpO2 100%   Physical Exam Constitutional:      General: She is not in acute distress.    Appearance: Normal appearance. She is well-developed. She is not ill-appearing, toxic-appearing or diaphoretic.  HENT:     Head: Normocephalic and atraumatic.     Right Ear: External ear normal.     Left Ear: External ear normal.     Nose: Nose normal.     Mouth/Throat:     Mouth: Mucous membranes are moist.      Pharynx: Oropharynx is clear.  Eyes:     General: No scleral icterus.       Right eye: No discharge.        Left eye: No discharge.     Extraocular Movements: Extraocular movements intact.     Conjunctiva/sclera: Conjunctivae normal.     Pupils: Pupils are equal, round, and reactive to light.  Cardiovascular:     Rate and Rhythm: Normal rate and regular rhythm.     Pulses: Normal pulses.     Heart sounds: Normal heart sounds. No murmur heard.  No friction rub. No gallop.   Pulmonary:     Effort: Pulmonary effort is normal. No respiratory distress.     Breath sounds: Normal breath sounds. No stridor. No wheezing, rhonchi or rales.  Chest:     Chest wall: Tenderness (over area outlined) present.    Skin:    General: Skin is warm and dry.     Findings: No rash.  Neurological:     General: No focal deficit present.     Mental Status: She is alert and oriented to person, place, and time.     Motor: No weakness.     Coordination: Coordination normal.     Gait: Gait normal.     Deep Tendon Reflexes: Reflexes normal.  Psychiatric:        Mood and Affect: Mood normal.        Behavior: Behavior normal.        Thought Content: Thought content normal.        Judgment: Judgment normal.      Assessment and Plan :   PDMP not reviewed this encounter.  1. Chest wall pain   2. Chest wall muscle strain, initial encounter   3. Acute gastritis, presence of bleeding unspecified, unspecified gastritis type   4. Vomiting, intractability of vomiting not specified, presence of nausea not specified, unspecified vomiting type     We will manage conservatively for what I suspect is a chest wall strain, possible gastritis due to her vomiting and alcohol use.  Given clear lung sounds and reassuring vital signs deferred imaging. Counseled patient on potential for adverse effects with medications prescribed/recommended today, ER and return-to-clinic precautions discussed, patient verbalized  understanding.    Wallis Bamberg, PA-C 12/01/19 1535

## 2019-12-01 NOTE — ED Triage Notes (Signed)
Pt states she has central chest discomfort when she breathes in and out; pt states she drank too much alcohol over the weekend and believes she may have strain a muscle in her chest vomiting.

## 2020-02-19 ENCOUNTER — Other Ambulatory Visit (HOSPITAL_BASED_OUTPATIENT_CLINIC_OR_DEPARTMENT_OTHER): Payer: Self-pay | Admitting: Family Medicine

## 2020-04-13 ENCOUNTER — Ambulatory Visit
Admission: EM | Admit: 2020-04-13 | Discharge: 2020-04-13 | Disposition: A | Discharge status: Home / Self Care | Payer: Medicaid Other | Attending: Emergency Medicine | Admitting: Emergency Medicine

## 2020-04-13 ENCOUNTER — Other Ambulatory Visit: Payer: Self-pay

## 2020-04-13 DIAGNOSIS — R42 Dizziness and giddiness: Secondary | ICD-10-CM | POA: Insufficient documentation

## 2020-04-13 DIAGNOSIS — Z113 Encounter for screening for infections with a predominantly sexual mode of transmission: Secondary | ICD-10-CM | POA: Insufficient documentation

## 2020-04-13 DIAGNOSIS — N926 Irregular menstruation, unspecified: Secondary | ICD-10-CM

## 2020-04-13 LAB — POCT URINALYSIS DIP (MANUAL ENTRY)
Bilirubin, UA: NEGATIVE
Blood, UA: NEGATIVE
Glucose, UA: NEGATIVE mg/dL
Ketones, POC UA: NEGATIVE mg/dL
Nitrite, UA: NEGATIVE
Protein Ur, POC: NEGATIVE mg/dL
Spec Grav, UA: 1.015 (ref 1.010–1.025)
Urobilinogen, UA: 0.2 E.U./dL
pH, UA: 7 (ref 5.0–8.0)

## 2020-04-13 LAB — POCT URINE PREGNANCY: Preg Test, Ur: NEGATIVE

## 2020-04-13 NOTE — ED Provider Notes (Signed)
[facility]    CSN: [id number] Arrival date & time: 04/13/20  1524      History   Chief Complaint Chief Complaint  Patient presents with  . Dizziness    HPI Mackenzie Santiago is a 25 y.o. female.   Mackenzie Santiago presents with complaints of dizziness and nausea which started yesterday. Feels like if she stands too long she could pass out. Has not lost consciousness. No other URI symptoms. No head injury. No urinary symptoms. She has missed a period, LMP 2/19. She is sexually active  And not on birth control, doesn't use condoms. Home pregnancy test had a "faint line."  No vaginal symptoms. No diarrhea. Hasn't taken any medications for symptoms. No vision changes. No abdominal pain. She does smoke. No known exposure to stds but inquires about these as well, concern for potential risk due to no condom use.     ROS per HPI, negative if not otherwise mentioned.      Past Medical History:  Diagnosis Date  . Heart murmur   . Hyperthyroidism   . Thyroid disease   . Trichimoniasis     Patient Active Problem List   Diagnosis Date Noted  . Obstetric labial laceration, delivered, current hospitalization 11/23/2015  . SVD (spontaneous vaginal delivery) 11/22/2015  . H/O anxiety state 11/22/2015  . Cigarette smoker 11/22/2015  . H/O sickle cell trait (FOB neg) 11/22/2015  . H/O chlamydia infection (neg TOC this pregnancy) 11/22/2015  . Group B streptococcal bacteriuria 11/22/2015  . H/O: depression 11/21/2015    Past Surgical History:  Procedure Laterality Date  . NO PAST SURGERIES      OB History    Gravida  1   Para  1   Term  1   Preterm      AB      Living  1     SAB      IAB      Ectopic      Multiple  0   Live Births  1            Home Medications    Prior to Admission medications   Medication Sig Start Date End Date Taking? Authorizing Provider  BIOTIN PO Take 1 tablet by mouth daily.    [provider]   Multiple Vitamin (MULTIVITAMIN WITH MINERALS) TABS tablet Take 1 tablet by mouth daily.    [provider]  OVER THE COUNTER MEDICATION Take 1 capsule by mouth daily. Seamoss    [provider]    Family History Family History  Problem Relation Age of Onset  . Sickle cell anemia Father   . Sickle cell anemia Maternal Grandfather   . Thyroid disease Paternal Grandmother   . Sickle cell anemia Paternal Grandfather   . Hypertension Mother   . Alcohol abuse Neg Hx   . Arthritis Neg Hx   . Asthma Neg Hx   . Birth defects Neg Hx   . Cancer Neg Hx   . COPD Neg Hx   . Depression Neg Hx   . Diabetes Neg Hx   . Drug abuse Neg Hx   . Early death Neg Hx   . Hearing loss Neg Hx   . Heart disease Neg Hx   . Hyperlipidemia Neg Hx   . Kidney disease Neg Hx   . Learning disabilities Neg Hx   . Mental illness Neg Hx   . Mental retardation Neg Hx   . Miscarriages / Stillbirths Neg  Hx   . Stroke Neg Hx   . Vision loss Neg Hx   . Varicose Veins Neg Hx     Social History Social History   Tobacco Use  . Smoking status: Current Every Day Smoker    Packs/day: 0.25    Types: Cigarettes  . Smokeless tobacco: Current User  Substance Use Topics  . Alcohol use: No    Comment: social  . Drug use: No     Allergies   Blueberry [vaccinium angustifolium]   Review of Systems Review of Systems   Physical Exam Triage Vital Signs ED Triage Vitals  Enc Vitals Group     BP 04/13/20 1657 128/76     Pulse Rate 04/13/20 1657 91     Resp 04/13/20 1657 20     Temp 04/13/20 1657 98.1 F (36.7 C)     Temp Source 04/13/20 1657 Oral     SpO2 04/13/20 1657 100 %     Weight --      Height --      Head Circumference --      Peak Flow --      Pain Score 04/13/20 1701 0     Pain Loc --      Pain Edu? --      Excl. in GC? --    Orthostatic VS for the past 24 hrs:  BP- Lying Pulse- Lying BP- Sitting Pulse- Sitting BP- Standing at 0 minutes Pulse- Standing at 0 minutes   04/13/20 1752 106/64 73 108/76 80 110/77 88    Updated Vital Signs BP 128/76 (BP Location: Left Arm)   Pulse 91   Temp 98.1 F (36.7 C) (Oral)   Resp 20   LMP 03/05/2020   SpO2 100%   Visual Acuity Right Eye Distance:   Left Eye Distance:   Bilateral Distance:    Right Eye Near:   Left Eye Near:    Bilateral Near:     Physical Exam Constitutional:      General: She is not in acute distress.    Appearance: She is well-developed.  Cardiovascular:     Rate and Rhythm: Normal rate.  Pulmonary:     Effort: Pulmonary effort is normal.  Abdominal:     Palpations: Abdomen is not rigid.     Tenderness: There is no abdominal tenderness. There is no guarding or rebound.  Genitourinary:    Comments: Denies sores, lesions, vaginal bleeding; no pelvic pain; gu exam deferred at this time, vaginal self swab collected.   Musculoskeletal:        General: Normal range of motion.  Skin:    General: Skin is warm and dry.  Neurological:     General: No focal deficit present.     Mental Status: She is alert and oriented to person, place, and time. Mental status is at baseline.     Cranial Nerves: No cranial nerve deficit.     Comments: Ambulatory without difficulty   Psychiatric:        Mood and Affect: Mood normal.      UC Treatments / Results  Labs (all labs ordered are listed, but only abnormal results are displayed) Labs Reviewed  POCT URINALYSIS DIP (MANUAL ENTRY) - Abnormal; Notable for the following components:      Result Value   Clarity, UA hazy (*)    Leukocytes, UA Trace (*)    All other components within normal limits  CBC  BASIC METABOLIC PANEL  TSH  POCT URINE PREGNANCY  CERVICOVAGINAL ANCILLARY ONLY    EKG   Radiology No results found.  Procedures Procedures (including critical care time)  Medications Ordered in UC Medications - No data to display  Initial Impression / Assessment and Plan / UC Course  I have reviewed the triage vital signs and  the nursing notes.  Pertinent labs & imaging results that were available during my care of the patient were reviewed by me and considered in my medical decision making (see chart for details).     Urine and orthostatic vitals are unremarkable. Vitals stable. No red flag findings, ambulatory without difficulty. Negative urine pregnancy test here tonight. Basic labs and tsh collected to further evaluate your dizziness. Encouraged follow up with PCP and/or gynecology for persistent symptoms or missed period. Return precautions provided. Patient verbalized understanding and agreeable to plan. Ambulatory out of clinic without difficulty.    Final Clinical Impressions(s) / UC Diagnoses   Final diagnoses:  Dizziness  Missed period     Discharge Instructions     Your vital signs and exam look well. Your urine specimen looks well also. Your pregnancy test is negative here today.  You may retest in another week if still no period.  If continued abnormal period I do recommend follow up with gynecology to establish care.  Drink plenty of water to maintain hydration.  We will call you if there are any abnormal findings with your lab tests, you may also see results on your MyChart.  Return for any worsening of symptoms.     ED Prescriptions    None     PDMP not reviewed this encounter.   Georgetta Haber, NP 04/13/20 1850

## 2020-04-13 NOTE — Discharge Instructions (Addendum)
Your vital signs and exam look well. Your urine specimen looks well also. Your pregnancy test is negative here today.  You may retest in another week if still no period.  If continued abnormal period I do recommend follow up with gynecology to establish care.  Drink plenty of water to maintain hydration.  We will call you if there are any abnormal findings with your lab tests, you may also see results on your MyChart.  Return for any worsening of symptoms.

## 2020-04-13 NOTE — ED Triage Notes (Signed)
Pt c/o dizziness and nausea since yesterday. States took a pregnancy test and her mom said it was positive but she doesn't believe it. States last menstrual cycle was 03/05/20.

## 2020-04-14 LAB — BASIC METABOLIC PANEL
BUN/Creatinine Ratio: 12 (ref 9–23)
BUN: 8 mg/dL (ref 6–20)
CO2: 22 mmol/L (ref 20–29)
Calcium: 9.8 mg/dL (ref 8.7–10.2)
Chloride: 102 mmol/L (ref 96–106)
Creatinine, Ser: 0.69 mg/dL (ref 0.57–1.00)
Glucose: 77 mg/dL (ref 65–99)
Potassium: 4.1 mmol/L (ref 3.5–5.2)
Sodium: 140 mmol/L (ref 134–144)
eGFR: 124 mL/min/{1.73_m2} (ref >59)

## 2020-04-14 LAB — CBC
Hematocrit: 40.8 % (ref 34.0–46.6)
Hemoglobin: 13.7 g/dL (ref 11.1–15.9)
MCH: 26.8 pg (ref 26.6–33.0)
MCHC: 33.6 g/dL (ref 31.5–35.7)
MCV: 80 fL (ref 79–97)
Platelets: 262 10*3/uL (ref 150–450)
RBC: 5.12 x10E6/uL (ref 3.77–5.28)
RDW: 13 % (ref 11.7–15.4)
WBC: 6 10*3/uL (ref 3.4–10.8)

## 2020-04-14 LAB — TSH: TSH: 0.146 u[IU]/mL — ABNORMAL LOW (ref 0.450–4.500)

## 2020-04-15 ENCOUNTER — Telehealth (HOSPITAL_COMMUNITY): Payer: Self-pay | Admitting: Emergency Medicine

## 2020-04-15 LAB — CERVICOVAGINAL ANCILLARY ONLY
Bacterial Vaginitis (gardnerella): POSITIVE — AB
Candida Glabrata: NEGATIVE
Candida Vaginitis: NEGATIVE
Chlamydia: NEGATIVE
Comment: NEGATIVE
Comment: NEGATIVE
Comment: NEGATIVE
Comment: NEGATIVE
Comment: NEGATIVE
Comment: NORMAL
Neisseria Gonorrhea: NEGATIVE
Trichomonas: NEGATIVE

## 2020-04-15 MED ORDER — METRONIDAZOLE 0.75 % VA GEL: 1.0000 | Freq: Every day | VAGINAL | 0 refills | Status: AC

## 2020-04-17 IMAGING — DX DG ABDOMEN ACUTE W/ 1V CHEST
3 series · 3 of 3 positions shown · non-contrast
Comparison: None.

CLINICAL DATA: Left-sided abdominal pain for 2 days.

EXAM:
DG ABDOMEN ACUTE W/ 1V CHEST

[chest pa]
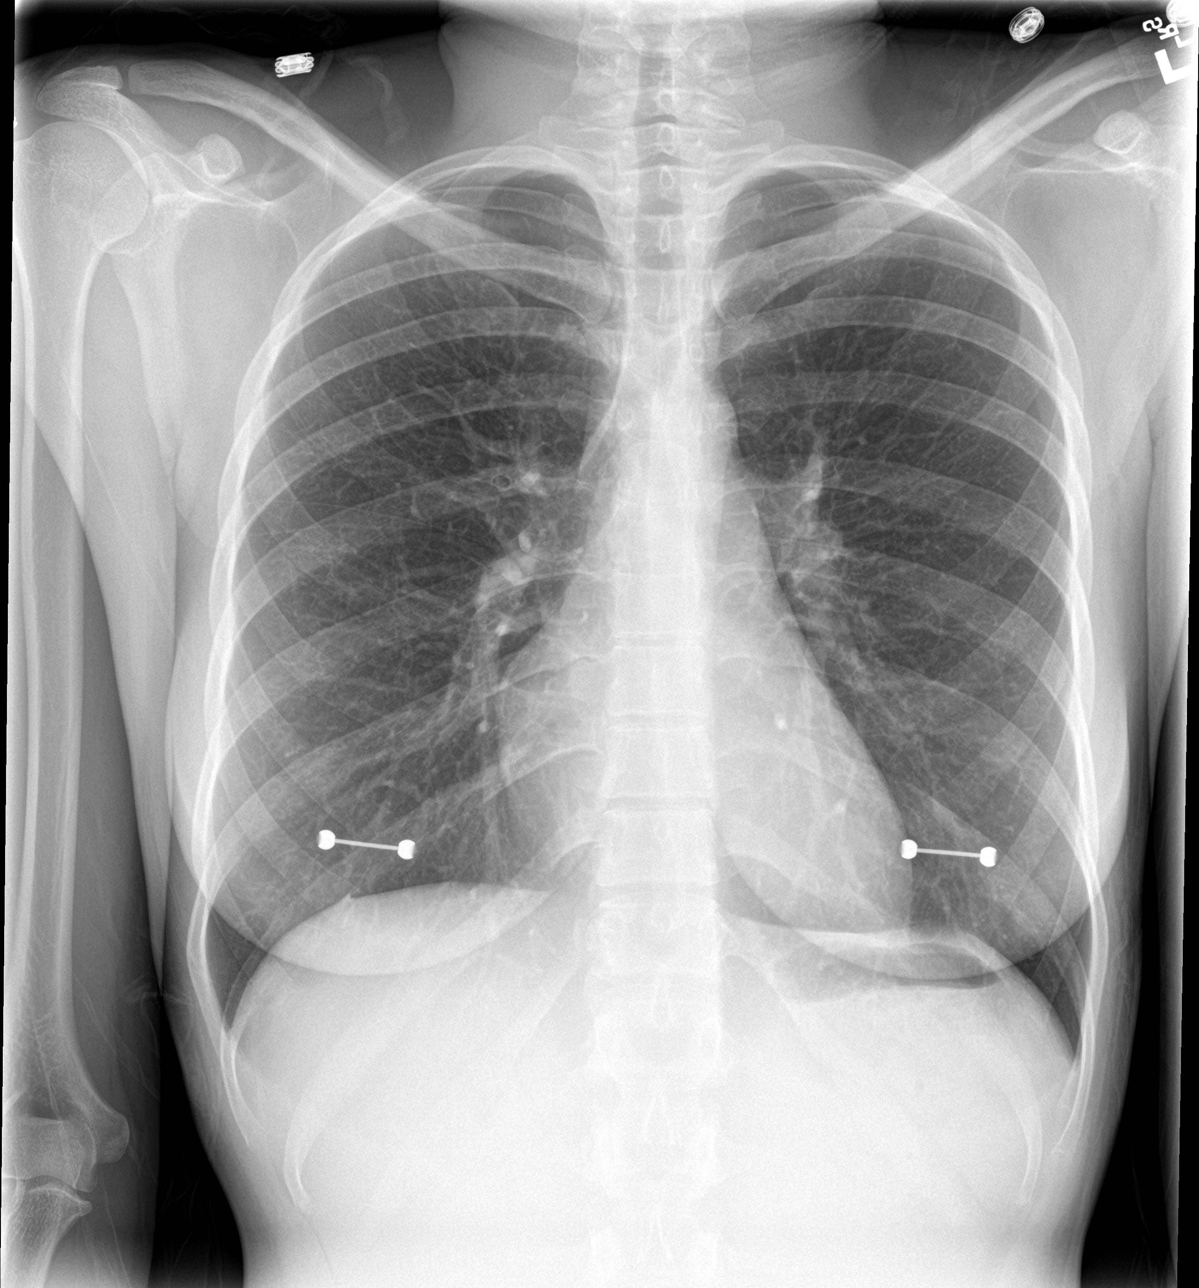

[abdomen erect]
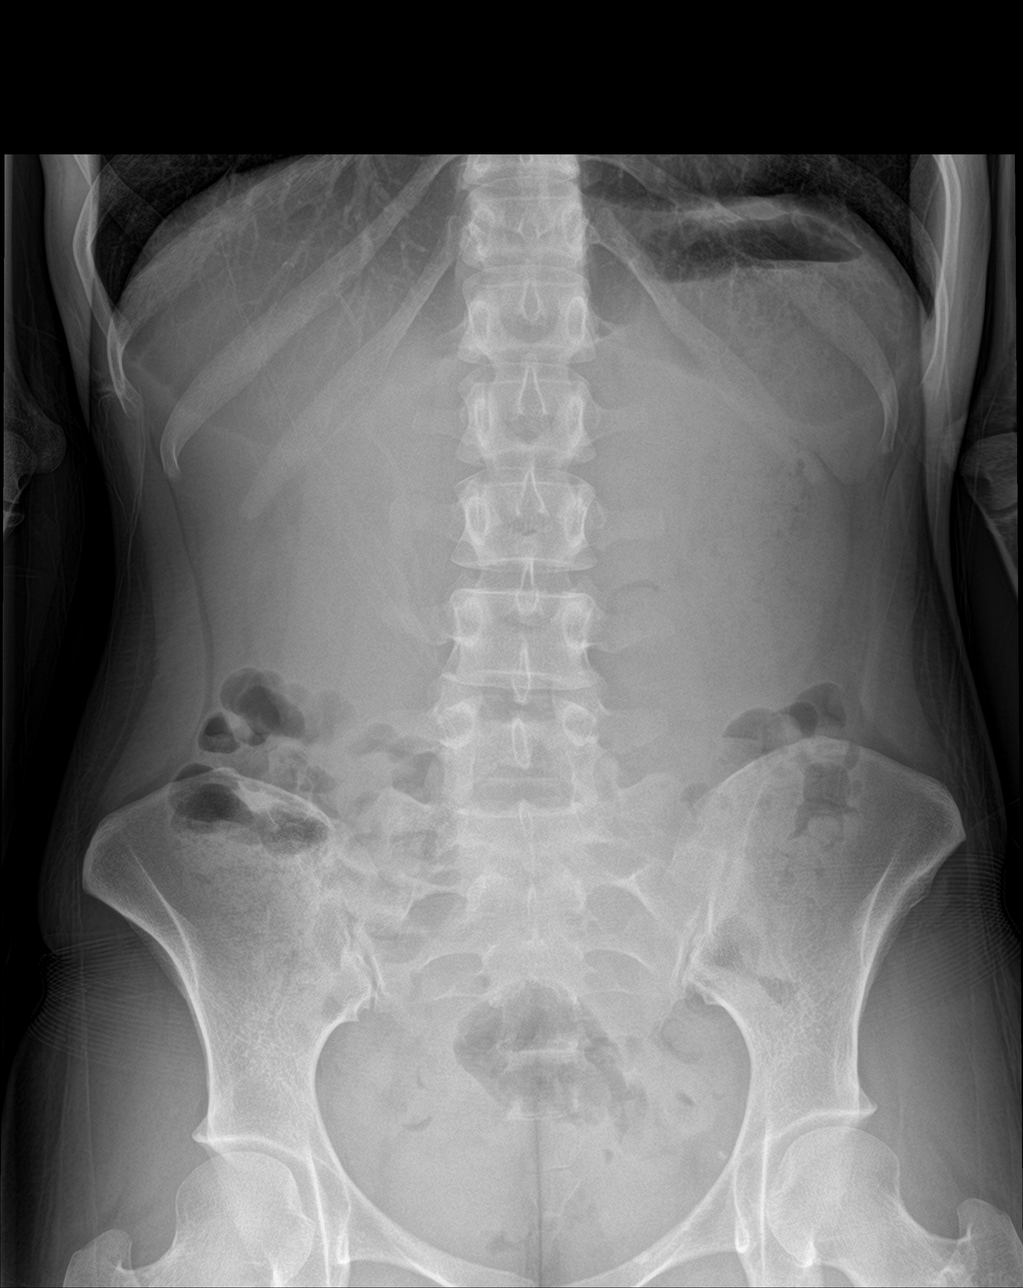

[abdomen supine]
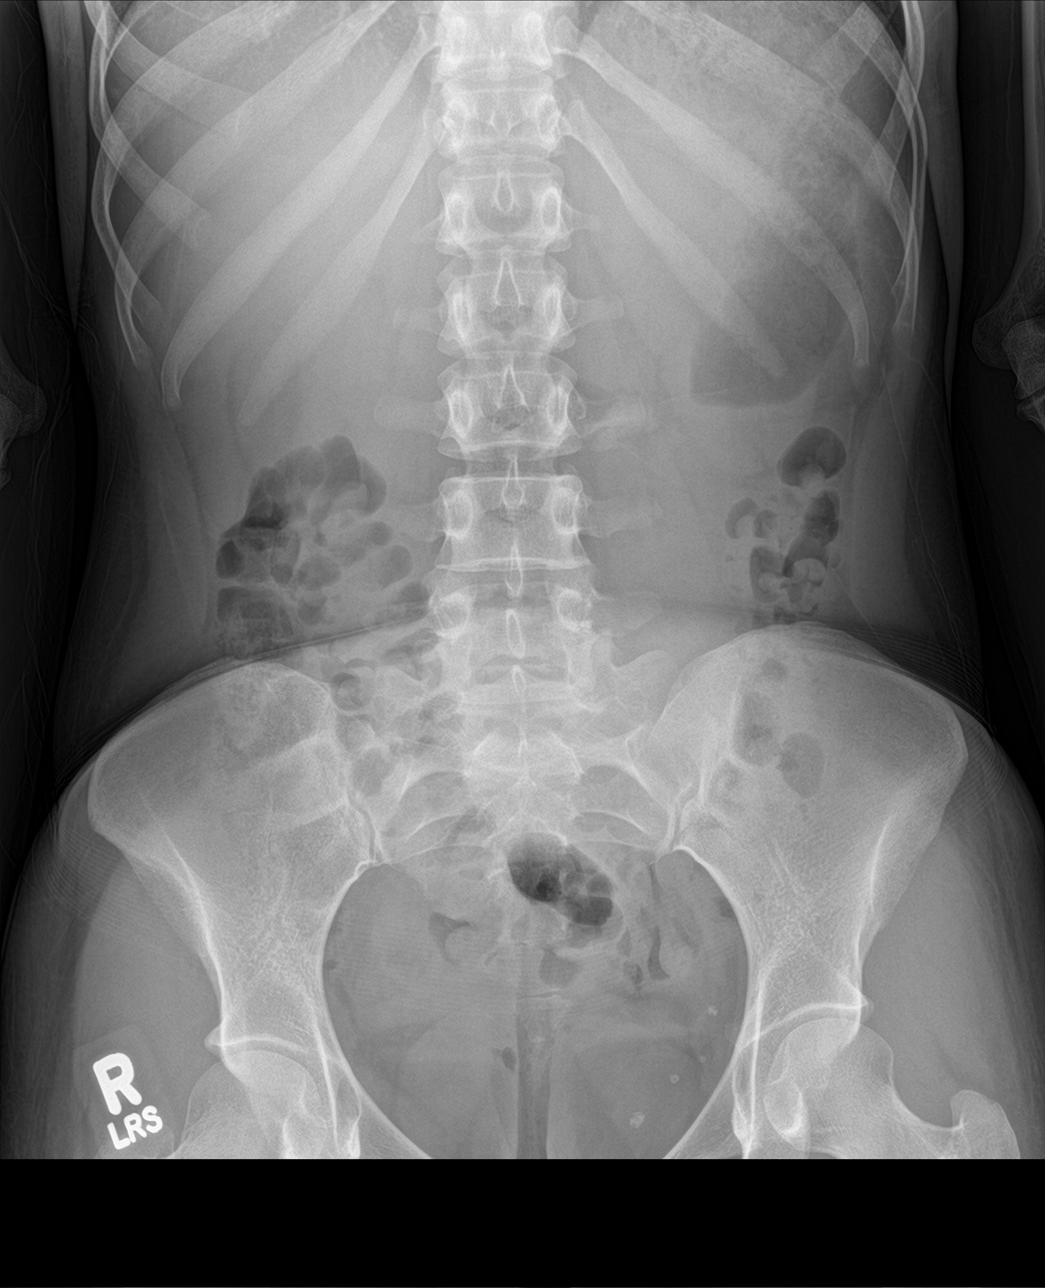

[3 of 3 positions shown; findings below may reference images not displayed]

FINDINGS: There is no evidence of dilated bowel loops or free intraperitoneal
air. No radiopaque calculi or other significant radiographic
abnormality is seen. Several phleboliths noted in the left pelvis.

Heart size and mediastinal contours are within normal limits. Both
lungs are clear.
IMPRESSION: Normal bowel gas pattern.  No acute findings.

No active cardiopulmonary disease.

## 2020-04-25 ENCOUNTER — Ambulatory Visit
Admission: EM | Admit: 2020-04-25 | Discharge: 2020-04-25 | Disposition: A | Discharge status: Home / Self Care | Payer: Medicaid Other | Attending: Emergency Medicine | Admitting: Emergency Medicine

## 2020-04-25 ENCOUNTER — Encounter: Payer: Self-pay | Admitting: Emergency Medicine

## 2020-04-25 ENCOUNTER — Other Ambulatory Visit: Payer: Self-pay

## 2020-04-25 DIAGNOSIS — N926 Irregular menstruation, unspecified: Secondary | ICD-10-CM | POA: Diagnosis not present

## 2020-04-25 LAB — POCT URINE PREGNANCY: Preg Test, Ur: NEGATIVE

## 2020-04-25 NOTE — ED Triage Notes (Signed)
Pt here for follow up blood work after having faint positive pregnancy test; pt sts still having faint positives at home; denies bleeding etc

## 2020-04-25 NOTE — Discharge Instructions (Signed)
We are checking blood work for [employer] for results  Please follow-up with primary care as planned for further treatment/work-up of thyroid

## 2020-04-25 NOTE — ED Provider Notes (Signed)
[facility]    CSN: [id number] Arrival date & time: 04/25/20  1428      History   Chief Complaint Chief Complaint  Patient presents with  . Follow-up    HPI Mackenzie Santiago is a 25 y.o. female presenting today for follow-up of possible pregnancy.  Last menstrual cycle was mid February 2022.  Notes that she has done multiple at home pregnancy tests which have had faint positive lines.  Was seen on 3/30 dizziness and nausea and urine pregnancy was negative.  Was noted to be positive for BV as well as had blood work done showing low TSH.  TSH was low, has plans to follow-up with PCP on 4/24.  Overall her dizziness and nausea has improved.  Denies any symptoms today.  Denies any nausea vomiting, abdominal pain.  HPI  Past Medical History:  Diagnosis Date  . Heart murmur   . Hyperthyroidism   . Thyroid disease   . Trichimoniasis     Patient Active Problem List   Diagnosis Date Noted  . Obstetric labial laceration, delivered, current hospitalization 11/23/2015  . SVD (spontaneous vaginal delivery) 11/22/2015  . H/O anxiety state 11/22/2015  . Cigarette smoker 11/22/2015  . H/O sickle cell trait (FOB neg) 11/22/2015  . H/O chlamydia infection (neg TOC this pregnancy) 11/22/2015  . Group B streptococcal bacteriuria 11/22/2015  . H/O: depression 11/21/2015    Past Surgical History:  Procedure Laterality Date  . NO PAST SURGERIES      OB History    Gravida  2   Para  1   Term  1   Preterm      AB      Living  1     SAB      IAB      Ectopic      Multiple  0   Live Births  1            Home Medications    Prior to Admission medications   Medication Sig Start Date End Date Taking? Authorizing Provider  BIOTIN PO Take 1 tablet by mouth daily.    [provider]  Multiple Vitamin (MULTIVITAMIN WITH MINERALS) TABS tablet Take 1 tablet by mouth daily.    [provider]  OVER THE COUNTER MEDICATION Take 1 capsule by  mouth daily. Seamoss    [provider]    Family History Family History  Problem Relation Age of Onset  . Sickle cell anemia Father   . Sickle cell anemia Maternal Grandfather   . Thyroid disease Paternal Grandmother   . Sickle cell anemia Paternal Grandfather   . Hypertension Mother   . Alcohol abuse Neg Hx   . Arthritis Neg Hx   . Asthma Neg Hx   . Birth defects Neg Hx   . Cancer Neg Hx   . COPD Neg Hx   . Depression Neg Hx   . Diabetes Neg Hx   . Drug abuse Neg Hx   . Early death Neg Hx   . Hearing loss Neg Hx   . Heart disease Neg Hx   . Hyperlipidemia Neg Hx   . Kidney disease Neg Hx   . Learning disabilities Neg Hx   . Mental illness Neg Hx   . Mental retardation Neg Hx   . Miscarriages / Stillbirths Neg Hx   . Stroke Neg Hx   . Vision loss Neg Hx   . Varicose Veins Neg Hx     Social  History Social History   Tobacco Use  . Smoking status: Current Every Day Smoker    Packs/day: 0.25    Types: Cigarettes  . Smokeless tobacco: Current User  Substance Use Topics  . Alcohol use: No    Comment: social  . Drug use: No     Allergies   Blueberry [vaccinium angustifolium]   Review of Systems Review of Systems  Constitutional: Negative for fever.  Respiratory: Negative for shortness of breath.   Cardiovascular: Negative for chest pain.  Gastrointestinal: Negative for abdominal pain, diarrhea, nausea and vomiting.  Genitourinary: Negative for dysuria, flank pain, genital sores, hematuria, menstrual problem, vaginal bleeding, vaginal discharge and vaginal pain.  Musculoskeletal: Negative for back pain.  Skin: Negative for rash.  Neurological: Negative for dizziness, light-headedness and headaches.     Physical Exam Triage Vital Signs ED Triage Vitals  Enc Vitals Group     BP      Pulse      Resp      Temp      Temp src      SpO2      Weight      Height      Head Circumference      Peak Flow      Pain Score      Pain Loc      Pain  Edu?      Excl. in GC?    No data found.  Updated Vital Signs BP 118/72 (BP Location: Left Arm)   Pulse (!) 105   Temp 98.7 F (37.1 C) (Oral)   Resp 18   LMP 03/05/2020   SpO2 96%   Visual Acuity Right Eye Distance:   Left Eye Distance:   Bilateral Distance:    Right Eye Near:   Left Eye Near:    Bilateral Near:     Physical Exam Vitals and nursing note reviewed.  Constitutional:      Appearance: She is well-developed.     Comments: No acute distress  HENT:     Head: Normocephalic and atraumatic.     Nose: Nose normal.  Eyes:     Conjunctiva/sclera: Conjunctivae normal.  Cardiovascular:     Rate and Rhythm: Normal rate.  Pulmonary:     Effort: Pulmonary effort is normal. No respiratory distress.  Abdominal:     General: There is no distension.  Musculoskeletal:        General: Normal range of motion.     Cervical back: Neck supple.  Skin:    General: Skin is warm and dry.  Neurological:     Mental Status: She is alert and oriented to person, place, and time.      UC Treatments / Results  Labs (all labs ordered are listed, but only abnormal results are displayed) Labs Reviewed  HCG, QUANTITATIVE, PREGNANCY  POCT URINE PREGNANCY    EKG   Radiology No results found.  Procedures Procedures (including critical care time)  Medications Ordered in UC Medications - No data to display  Initial Impression / Assessment and Plan / UC Course  I have reviewed the triage vital signs and the nursing notes.  Pertinent labs & imaging results that were available during my care of the patient were reviewed by me and considered in my medical decision making (see chart for details).     Repeat urine pregnancy today negative, will obtain serum hCG for further confirmation given patient has reported faint positives at home as well as light/irregular cycle.  Suspect menstrual irregularities may be related to hyperthyroid given her TSH low, encouraged follow-up with  PCP as planned later this month for further evaluation of this.  Discussed strict return precautions. Patient verbalized understanding and is agreeable with plan.  Final Clinical Impressions(s) / UC Diagnoses   Final diagnoses:  Missed period     Discharge Instructions     We are checking blood work for [employer] for results  Please follow-up with primary care as planned for further treatment/work-up of thyroid    ED Prescriptions    None     PDMP not reviewed this encounter.   Lew Dawes, PA-C 04/25/20 1512

## 2020-04-27 LAB — SPECIMEN STATUS REPORT

## 2020-04-27 LAB — BETA HCG QUANT (REF LAB): hCG Quant: <1 m[IU]/mL

## 2020-06-06 NOTE — Progress Notes (Signed)
Virtual Visit via Telephone Note  I connected with [employer], on 06/07/2020 at 11:27 AM by telephone due to the COVID-19 pandemic and verified that I am speaking with the correct person using two identifiers.  Due to current restrictions/limitations of in-office visits due to the COVID-19 pandemic, this scheduled clinical appointment was converted to a telehealth visit.   Consent: I discussed the limitations, risks, security and privacy concerns of performing an evaluation and management service by telephone and the availability of in person appointments. I also discussed with the patient that there may be a patient responsible charge related to this service. The patient expressed understanding and agreed to proceed.   Location of Patient: Home  Location of Provider: [facility] at [facility]   Persons participating in Telemedicine visit: Damiyah Martin-King Ricky Stabs, NP Margorie John, CMA  History of Present Illness: Mackenzie Santiago is a 25 year-old female who presents to establish care. PMH significant for thyroid disease, hyperthyroidism, heart murmur, trichimoniasis, anxiety state, depression, and cigarette smoker.  Current issues and/or concerns: 1. URGENT CARE DISCHARGE FOLLOW-UP: 04/25/2020: Repeat urine pregnancy today negative, will obtain serum hCG for further confirmation given patient has reported faint positives at home as well as light/irregular cycle.  Suspect menstrual irregularities may be related to hyperthyroid given her TSH low, encouraged follow-up with PCP as planned later this month for further evaluation of this.  Discussed strict return precautions. Patient verbalized understanding and is agreeable with plan.  06/07/2020: Concern for hyperthyroid.  Fatigue: no Cold intolerance: no, prefers being cold Heat intolerance: yes Weight gain: trying to gain weight Weight loss: no Constipation: no Diarrhea/loose stools:  no Palpitations: no Lower extremity edema: no Anxiety/depressed mood: yes, reports for several years even before diagnosis, denies thoughts of self-harm, suicidal ideations, and homicidal ideations   Reports concern for pain after intercourse. Still having irregular periods with negative home pregnancy tests. Reports she is not trying to conceive at this moment. However, she does have concern as she is having unprotected intercourse and has not become pregnant. She does have a 25 year-old.    Past Medical History:  Diagnosis Date  . Heart murmur   . Hyperthyroidism   . Thyroid disease   . Trichimoniasis    Allergies  Allergen Reactions  . Blueberry [Vaccinium Angustifolium] Hives         Current Outpatient Medications on File Prior to Visit  Medication Sig Dispense Refill  . BIOTIN PO Take 1 tablet by mouth daily.    . Multiple Vitamin (MULTIVITAMIN WITH MINERALS) TABS tablet Take 1 tablet by mouth daily.    Marland Kitchen OVER THE COUNTER MEDICATION Take 1 capsule by mouth daily. Seamoss     No current facility-administered medications on file prior to visit.    Observations/Objective: Alert and oriented x 3. Not in acute distress. Physical examination not completed as this is a telemedicine visit.  Assessment and Plan: 1. Encounter to establish care: - Patient presents today to establish care.  - Return for annual physical examination, labs, and health maintenance. Arrive fasting meaning having no food for at least 8 hours prior to appointment. You may have only water or black coffee. Please take scheduled medications as normal.  2. Hyperthyroidism: - Newly diagnosed.  - TSH last obtained 04/13/2020. - Referral to Endocrinology for further evaluation and management.  - Ambulatory referral to Endocrinology  3. Irregular menses: 4. Fertility testing: - Referral to Gynecology for further evaluation and management. - Ambulatory referral to Gynecology  Follow Up Instructions: Return  for annual physical exam.   Patient was given clear instructions to go to Emergency Department or return to medical center if symptoms don't improve, worsen, or new problems develop.The patient verbalized understanding.  I discussed the assessment and treatment plan with the patient. The patient was provided an opportunity to ask questions and all were answered. The patient agreed with the plan and demonstrated an understanding of the instructions.   The patient was advised to call back or seek an in-person evaluation if the symptoms worsen or if the condition fails to improve as anticipated.   I provided 10 minutes total of non-face-to-face time during this encounter.   Rema Fendt, NP  [facility] [place], [place] [phone] 06/07/2020, 11:27 AM

## 2020-06-07 ENCOUNTER — Encounter: Payer: Self-pay | Admitting: Family

## 2020-06-07 ENCOUNTER — Telehealth (INDEPENDENT_AMBULATORY_CARE_PROVIDER_SITE_OTHER): Payer: Medicaid Other | Admitting: Family

## 2020-06-07 ENCOUNTER — Other Ambulatory Visit: Payer: Self-pay

## 2020-06-07 DIAGNOSIS — Z7689 Persons encountering health services in other specified circumstances: Secondary | ICD-10-CM

## 2020-06-07 DIAGNOSIS — N926 Irregular menstruation, unspecified: Secondary | ICD-10-CM | POA: Diagnosis not present

## 2020-06-07 DIAGNOSIS — Z3141 Encounter for fertility testing: Secondary | ICD-10-CM

## 2020-06-07 DIAGNOSIS — E0590 Thyrotoxicosis, unspecified without thyrotoxic crisis or storm: Secondary | ICD-10-CM

## 2020-06-07 NOTE — Progress Notes (Signed)
Establish care Thyroid issues

## 2020-06-26 DIAGNOSIS — E0590 Thyrotoxicosis, unspecified without thyrotoxic crisis or storm: Secondary | ICD-10-CM | POA: Insufficient documentation

## 2020-06-26 NOTE — Progress Notes (Signed)
Patient did not show for appointment.   

## 2020-06-27 ENCOUNTER — Encounter: Payer: Medicaid Other | Admitting: Family

## 2020-08-25 ENCOUNTER — Encounter: Payer: Medicaid Other | Admitting: Obstetrics and Gynecology

## 2020-08-27 ENCOUNTER — Ambulatory Visit (HOSPITAL_COMMUNITY)
Admission: EM | Admit: 2020-08-27 | Discharge: 2020-08-27 | Disposition: A | Discharge status: Home / Self Care | Payer: Medicaid Other | Attending: Student | Admitting: Student

## 2020-08-27 ENCOUNTER — Encounter (HOSPITAL_COMMUNITY): Payer: Self-pay

## 2020-08-27 ENCOUNTER — Other Ambulatory Visit: Payer: Self-pay

## 2020-08-27 DIAGNOSIS — Z113 Encounter for screening for infections with a predominantly sexual mode of transmission: Secondary | ICD-10-CM | POA: Diagnosis not present

## 2020-08-27 DIAGNOSIS — N898 Other specified noninflammatory disorders of vagina: Secondary | ICD-10-CM | POA: Diagnosis not present

## 2020-08-27 LAB — POCT URINALYSIS DIPSTICK, ED / UC
Bilirubin Urine: NEGATIVE
Glucose, UA: NEGATIVE mg/dL
Hgb urine dipstick: NEGATIVE
Leukocytes,Ua: NEGATIVE
Nitrite: NEGATIVE
Protein, ur: NEGATIVE mg/dL
Specific Gravity, Urine: 1.015 (ref 1.005–1.030)
Urobilinogen, UA: 0.2 mg/dL (ref 0.0–1.0)
pH: 8.5 — ABNORMAL HIGH (ref 5.0–8.0)

## 2020-08-27 LAB — POC URINE PREG, ED: Preg Test, Ur: NEGATIVE

## 2020-08-27 MED ORDER — METRONIDAZOLE 500 MG PO TABS: 500.0000 mg | ORAL_TABLET | Freq: Two times a day (BID) | ORAL | 0 refills | Status: DC

## 2020-08-27 MED ORDER — CEFTRIAXONE SODIUM 500 MG IJ SOLR
500.0000 mg | Freq: Once | INTRAMUSCULAR | Status: AC
  Administered 2020-08-27: 500 mg via INTRAMUSCULAR

## 2020-08-27 MED ORDER — CEFTRIAXONE SODIUM 500 MG IJ SOLR
INTRAMUSCULAR | Status: AC
  Filled 2020-08-27: qty 500

## 2020-08-27 MED ORDER — DOXYCYCLINE HYCLATE 100 MG PO CAPS: 100.0000 mg | ORAL_CAPSULE | Freq: Two times a day (BID) | ORAL | 0 refills | Status: AC

## 2020-08-27 MED ORDER — LIDOCAINE HCL (PF) 1 % IJ SOLN
INTRAMUSCULAR | Status: AC
  Filled 2020-08-27: qty 2

## 2020-08-27 MED ORDER — FLUCONAZOLE 150 MG PO TABS: 150.0000 mg | ORAL_TABLET | Freq: Every day | ORAL | 0 refills | Status: DC

## 2020-08-27 NOTE — Discharge Instructions (Addendum)
-  We are treating you for gonorrhea with an antibiotic called Rocephin today. -For chlamydia, doxycycline twice daily for 7 days.  Make sure to wear sunscreen while spending time outside while on this medication as it can increase your chance of sunburn. You can take this medication with food if you have a sensitive stomach. -For bacterial vaginosis and trichomonas, start the antibiotic-Flagyl (metronidazole), 2 pills daily for 7 days.  You can take this with food if you have a sensitive stomach.  Avoid alcohol while taking this medication and for 2 days after as this will cause severe nausea and vomiting. -Diflucan if you develop a yeast infection -Abstain from intercourse until treatment is complete -Seek additional medical attention if symptoms are getting worse instead of better, like worsening abdominal pain, back pain, new fever/chills.

## 2020-08-27 NOTE — ED Triage Notes (Signed)
Pt reports lower abdominal pain and yellow vaginal discharge.

## 2020-08-27 NOTE — ED Notes (Signed)
Urine in lab 

## 2020-08-27 NOTE — ED Provider Notes (Signed)
[facility]    CSN: [id number] Arrival date & time: 08/27/20  1733      History   Chief Complaint Chief Complaint  Patient presents with   Abdominal Pain    HPI Thereasa Santiago is a 25 y.o. female presenting with abd pain and malodorous yellow vaginal discharge for 3 days following unprotected intercourse with new female partner. Medical history trichomonas. Denies hematuria, dysuria, frequency, urgency, back pain, n/v/d, fevers/chills, abdnormal vaginal  rashes/lesions.  HPI  Past Medical History:  Diagnosis Date   Heart murmur    Hyperthyroidism    Thyroid disease    Trichimoniasis     Patient Active Problem List   Diagnosis Date Noted   Hyperthyroidism 06/26/2020   Obstetric labial laceration, delivered, current hospitalization 11/23/2015   SVD (spontaneous vaginal delivery) 11/22/2015   H/O anxiety state 11/22/2015   H/O sickle cell trait (FOB neg) 11/22/2015   H/O chlamydia infection (neg TOC this pregnancy) 11/22/2015   Group B streptococcal bacteriuria 11/22/2015   H/O: depression 11/21/2015   Alcohol abuse 03/23/2014   Smoker 03/23/2014   Thyroid dysfunction 05/07/2011   Tonsillar hypertrophy 05/07/2011   Abnormal weight loss 02/19/2011    Past Surgical History:  Procedure Laterality Date   NO PAST SURGERIES      OB History     Gravida  2   Para  1   Term  1   Preterm      AB      Living  1      SAB      IAB      Ectopic      Multiple  0   Live Births  1            Home Medications    Prior to Admission medications   Medication Sig Start Date End Date Taking? Authorizing Provider  CHLOROPHYLL PO Take by mouth.   Yes [provider]  doxycycline (VIBRAMYCIN) 100 MG capsule Take 1 capsule (100 mg total) by mouth 2 (two) times daily for 7 days. 08/27/20 09/03/20 Yes Rhys Martini, PA-C  fluconazole (DIFLUCAN) 150 MG tablet Take 1 tablet (150 mg total) by mouth daily. If you develop yeast infection  following antibiotics:  start Mackenzie Diflucan (fluconazole)- Take one pill on first day of symptoms (day 1). If you're still having symptoms in 3 days, take Mackenzie second pill. 08/27/20  Yes Rhys Martini, PA-C  MACA ROOT PO Take by mouth.   Yes [provider]  metroNIDAZOLE (FLAGYL) 500 MG tablet Take 1 tablet (500 mg total) by mouth 2 (two) times daily. 08/27/20  Yes Rhys Martini, PA-C  BIOTIN PO Take 1 tablet by mouth daily.    [provider]  Multiple Vitamin (MULTIVITAMIN WITH MINERALS) TABS tablet Take 1 tablet by mouth daily.    [provider]  OVER Mackenzie COUNTER MEDICATION Take 1 capsule by mouth daily. Seamoss    [provider]    Family History Family History  Problem Relation Age of Onset   Sickle cell anemia Father    Sickle cell anemia Maternal Grandfather    Thyroid disease Paternal Grandmother    Sickle cell anemia Paternal Grandfather    Hypertension Mother    Alcohol abuse Neg Hx    Arthritis Neg Hx    Asthma Neg Hx    Birth defects Neg Hx    Cancer Neg Hx    COPD Neg Hx    Depression Neg Hx  Diabetes Neg Hx    Drug abuse Neg Hx    Early death Neg Hx    Hearing loss Neg Hx    Heart disease Neg Hx    Hyperlipidemia Neg Hx    Kidney disease Neg Hx    Learning disabilities Neg Hx    Mental illness Neg Hx    Mental retardation Neg Hx    Miscarriages / Stillbirths Neg Hx    Stroke Neg Hx    Vision loss Neg Hx    Varicose Veins Neg Hx     Social History Social History   Tobacco Use   Smoking status: Former    Packs/day: 0.25    Types: Cigarettes   Smokeless tobacco: Never  Vaping Use   Vaping Use: Every day  Substance Use Topics   Alcohol use: No    Comment: social   Drug use: No     Allergies   Blueberry [vaccinium angustifolium]   Review of Systems Review of Systems  Constitutional:  Negative for appetite change, chills, diaphoresis and fever.  Respiratory:  Negative for shortness of breath.    Cardiovascular:  Negative for chest pain.  Gastrointestinal:  Positive for abdominal pain. Negative for blood in stool, constipation, diarrhea, nausea and vomiting.  Genitourinary:  Positive for vaginal discharge. Negative for decreased urine volume, difficulty urinating, dysuria, flank pain, frequency, genital sores, hematuria, urgency, vaginal bleeding and vaginal pain.  Musculoskeletal:  Negative for back pain.  Neurological:  Negative for dizziness, weakness and light-headedness.  All other systems reviewed and are negative.   Physical Exam Triage Vital Signs ED Triage Vitals  Enc Vitals Group     BP 08/27/20 1749 118/71     Pulse Rate 08/27/20 1749 (!) 102     Resp 08/27/20 1749 18     Temp 08/27/20 1749 99.1 F (37.3 C)     Temp Source 08/27/20 1749 Oral     SpO2 08/27/20 1749 100 %     Weight --      Height --      Head Circumference --      Peak Flow --      Pain Score 08/27/20 1746 6     Pain Loc --      Pain Edu? --      Excl. in GC? --    No data found.  Updated Vital Signs BP 118/71 (BP Location: Right Arm)   Pulse (!) 102   Temp 99.1 F (37.3 C) (Oral)   Resp 18   SpO2 100%   Visual Acuity Right Eye Distance:   Left Eye Distance:   Bilateral Distance:    Right Eye Near:   Left Eye Near:    Bilateral Near:     Physical Exam Vitals reviewed.  Constitutional:      General: She is not in acute distress.    Appearance: Normal appearance. She is not ill-appearing.  HENT:     Head: Normocephalic and atraumatic.     Mouth/Throat:     Mouth: Mucous membranes are moist.     Comments: Moist mucous membranes Eyes:     Extraocular Movements: Extraocular movements intact.     Pupils: Pupils are equal, round, and reactive to light.  Cardiovascular:     Rate and Rhythm: Regular rhythm. Tachycardia present.     Heart sounds: Normal heart sounds.  Pulmonary:     Effort: Pulmonary effort is normal.     Breath sounds: Normal breath sounds. No wheezing,  rhonchi or rales.  Abdominal:     General: Bowel sounds are normal. There is no distension.     Palpations: Abdomen is soft. There is no mass.     Tenderness: There is abdominal tenderness in Mackenzie suprapubic area. There is no right CVA tenderness, left CVA tenderness, guarding or rebound. Negative signs include Murphy's sign, Rovsing's sign and McBurney's sign.  Skin:    General: Skin is warm.     Capillary Refill: Capillary refill takes less than 2 seconds.     Comments: Good skin turgor  Neurological:     General: No focal deficit present.     Mental Status: She is alert and oriented to person, place, and time.  Psychiatric:        Mood and Affect: Mood normal.        Behavior: Behavior normal.     UC Treatments / Results  Labs (all labs ordered are listed, but only abnormal results are displayed) Labs Reviewed  POCT URINALYSIS DIPSTICK, ED / UC - Abnormal; Notable for Mackenzie following components:      Result Value   Ketones, ur TRACE (*)    pH 8.5 (*)    All other components within normal limits  POC URINE PREG, ED  CERVICOVAGINAL ANCILLARY ONLY    EKG   Radiology No results found.  Procedures Procedures (including critical care time)  Medications Ordered in UC Medications  cefTRIAXone (ROCEPHIN) injection 500 mg (has no administration in time range)    Initial Impression / Assessment and Plan / UC Course  I have reviewed Mackenzie triage vital signs and Mackenzie nursing notes.  Pertinent labs & imaging results that were available during my care of Mackenzie patient were reviewed by me and considered in my medical decision making (see chart for details).     This patient is a very pleasant 25 y.o. year old female presenting with concern for STI- unprotected intercourse with new female partner. Borderline febrile and tachycardic. Suprapubic tenderness but no CVAT.  UA wnl, did not send culture Urine pregnancy negative  Will send self-swab for G/C, trich, yeast, BV testing.  Declines HIV, RPR.  Following discussion of risks and benefits patient elects to treat for G/C/trich today. Diflucan for yeast prophylaxis.   Last visit for same was 02/2020.   ED return precautions discussed. Patient verbalizes understanding and agreement.   Coding Level 4 for review of past notes/labs, order and interpretation of labs today, and prescription drug management  Final Clinical Impressions(s) / UC Diagnoses   Final diagnoses:  Vaginal discharge  Routine screening for STI (sexually transmitted infection)     Discharge Instructions      -We are treating you for gonorrhea with an antibiotic called Rocephin today. -For chlamydia, doxycycline twice daily for 7 days.  Make sure to wear sunscreen while spending time outside while on this medication as it can increase your chance of sunburn. You can take this medication with food if you have a sensitive stomach. -For bacterial vaginosis and trichomonas, start Mackenzie antibiotic-Flagyl (metronidazole), 2 pills daily for 7 days.  You can take this with food if you have a sensitive stomach.  Avoid alcohol while taking this medication and for 2 days after as this will cause severe nausea and vomiting. -Diflucan if you develop a yeast infection -Abstain from intercourse until treatment is complete -Seek additional medical attention if symptoms are getting worse instead of better, like worsening abdominal pain, back pain, new fever/chills.  ED Prescriptions     Medication Sig Dispense Auth. Provider   doxycycline (VIBRAMYCIN) 100 MG capsule Take 1 capsule (100 mg total) by mouth 2 (two) times daily for 7 days. 14 capsule Rhys Martini, PA-C   metroNIDAZOLE (FLAGYL) 500 MG tablet Take 1 tablet (500 mg total) by mouth 2 (two) times daily. 14 tablet Rhys Martini, PA-C   fluconazole (DIFLUCAN) 150 MG tablet Take 1 tablet (150 mg total) by mouth daily. If you develop yeast infection following antibiotics:  start Mackenzie Diflucan  (fluconazole)- Take one pill on first day of symptoms (day 1). If you're still having symptoms in 3 days, take Mackenzie second pill. 2 tablet Rhys Martini, PA-C      PDMP not reviewed this encounter.   Rhys Martini, PA-C 08/27/20 1827

## 2020-08-29 LAB — CERVICOVAGINAL ANCILLARY ONLY
Bacterial Vaginitis (gardnerella): POSITIVE — AB
Candida Glabrata: NEGATIVE
Candida Vaginitis: NEGATIVE
Chlamydia: NEGATIVE
Comment: NEGATIVE
Comment: NEGATIVE
Comment: NEGATIVE
Comment: NEGATIVE
Comment: NEGATIVE
Comment: NORMAL
Neisseria Gonorrhea: NEGATIVE
Trichomonas: NEGATIVE

## 2020-09-12 ENCOUNTER — Encounter (HOSPITAL_COMMUNITY): Payer: Self-pay

## 2020-09-12 ENCOUNTER — Emergency Department (HOSPITAL_COMMUNITY)
Admission: EM | Admit: 2020-09-12 | Discharge: 2020-09-12 | Disposition: A | Discharge status: Home / Self Care | Payer: Medicaid Other | Attending: Emergency Medicine | Admitting: Emergency Medicine

## 2020-09-12 ENCOUNTER — Other Ambulatory Visit: Payer: Self-pay

## 2020-09-12 DIAGNOSIS — R102 Pelvic and perineal pain: Secondary | ICD-10-CM | POA: Insufficient documentation

## 2020-09-12 DIAGNOSIS — R300 Dysuria: Secondary | ICD-10-CM | POA: Insufficient documentation

## 2020-09-12 DIAGNOSIS — Z5321 Procedure and treatment not carried out due to patient leaving prior to being seen by health care provider: Secondary | ICD-10-CM | POA: Diagnosis not present

## 2020-09-12 LAB — CBC WITH DIFFERENTIAL/PLATELET
Abs Immature Granulocytes: 0.02 10*3/uL (ref 0.00–0.07)
Basophils Absolute: 0 10*3/uL (ref 0.0–0.1)
Basophils Relative: 1 %
Eosinophils Absolute: 0.3 10*3/uL (ref 0.0–0.5)
Eosinophils Relative: 6 %
HCT: 35.9 % — ABNORMAL LOW (ref 36.0–46.0)
Hemoglobin: 12.1 g/dL (ref 12.0–15.0)
Immature Granulocytes: 0 %
Lymphocytes Relative: 41 %
Lymphs Abs: 2.2 10*3/uL (ref 0.7–4.0)
MCH: 26 pg (ref 26.0–34.0)
MCHC: 33.7 g/dL (ref 30.0–36.0)
MCV: 77 fL — ABNORMAL LOW (ref 80.0–100.0)
Monocytes Absolute: 0.5 10*3/uL (ref 0.1–1.0)
Monocytes Relative: 9 %
Neutro Abs: 2.3 10*3/uL (ref 1.7–7.7)
Neutrophils Relative %: 43 %
Platelets: 216 10*3/uL (ref 150–400)
RBC: 4.66 MIL/uL (ref 3.87–5.11)
RDW: 13.6 % (ref 11.5–15.5)
WBC: 5.4 10*3/uL (ref 4.0–10.5)
nRBC: 0 % (ref 0.0–0.2)

## 2020-09-12 LAB — COMPREHENSIVE METABOLIC PANEL
ALT: 22 U/L (ref 0–44)
AST: 20 U/L (ref 15–41)
Albumin: 3.4 g/dL — ABNORMAL LOW (ref 3.5–5.0)
Alkaline Phosphatase: 67 U/L (ref 38–126)
Anion gap: 5 (ref 5–15)
BUN: 9 mg/dL (ref 6–20)
CO2: 24 mmol/L (ref 22–32)
Calcium: 9.1 mg/dL (ref 8.9–10.3)
Chloride: 107 mmol/L (ref 98–111)
Creatinine, Ser: 0.55 mg/dL (ref 0.44–1.00)
GFR, Estimated: >60 mL/min (ref >60)
Glucose, Bld: 97 mg/dL (ref 70–99)
Potassium: 3.7 mmol/L (ref 3.5–5.1)
Sodium: 136 mmol/L (ref 135–145)
Total Bilirubin: 0.5 mg/dL (ref 0.3–1.2)
Total Protein: 6.3 g/dL — ABNORMAL LOW (ref 6.5–8.1)

## 2020-09-12 LAB — I-STAT BETA HCG BLOOD, ED (MC, WL, AP ONLY): I-stat hCG, quantitative: <5 m[IU]/mL (ref <5)

## 2020-09-12 LAB — LIPASE, BLOOD: Lipase: 30 U/L (ref 11–51)

## 2020-09-12 NOTE — ED Notes (Signed)
Pt called for vitals. No response.  

## 2020-09-12 NOTE — ED Triage Notes (Signed)
Patient complains of lower abdominal pain with vaginal pressure x 1 week. Nausea with same. Patient recently treated for BV and did not finish meds

## 2020-09-12 NOTE — ED Provider Notes (Signed)
Emergency Medicine Provider Triage Evaluation Note  Mackenzie Santiago , a 25 y.o. female  was evaluated in triage.  Pt complains of pelvic pain.  Review of Systems  Positive: Pelvic pain, dysuria Negative: Vaginal bleeding, vaginal discharge, n/v/d, fever  Physical Exam  BP 130/65 (BP Location: Right Arm)   Pulse 85   Temp 98.8 F (37.1 C) (Oral)   Resp 16   SpO2 92%  Gen:   Awake, no distress   Resp:  Normal effort  MSK:   Moves extremities without difficulty  Other:    Medical Decision Making  Medically screening exam initiated at 10:02 AM.  Appropriate orders placed.  Mackenzie Santiago was informed that the remainder of the evaluation will be completed by another provider, this initial triage assessment does not replace that evaluation, and the importance of remaining in the ED until their evaluation is complete.  Pt here with lower pelvic pain x 1 week, also report urinary discomfort.  No new sexual partner.    Fayrene Helper, PA-C 09/12/20 1003    Gloris Manchester, MD 09/14/20 [phone]

## 2020-09-28 ENCOUNTER — Encounter: Payer: Medicaid Other | Admitting: Obstetrics and Gynecology

## 2020-11-13 ENCOUNTER — Encounter (HOSPITAL_COMMUNITY): Payer: Self-pay

## 2020-11-13 ENCOUNTER — Other Ambulatory Visit: Payer: Self-pay

## 2020-11-13 ENCOUNTER — Emergency Department (HOSPITAL_COMMUNITY)
Admission: EM | Admit: 2020-11-13 | Discharge: 2020-11-13 | Disposition: A | Discharge status: Home / Self Care | Payer: Medicaid Other | Attending: Emergency Medicine | Admitting: Emergency Medicine

## 2020-11-13 DIAGNOSIS — X58XXXA Exposure to other specified factors, initial encounter: Secondary | ICD-10-CM | POA: Insufficient documentation

## 2020-11-13 DIAGNOSIS — Z87891 Personal history of nicotine dependence: Secondary | ICD-10-CM | POA: Diagnosis not present

## 2020-11-13 DIAGNOSIS — E039 Hypothyroidism, unspecified: Secondary | ICD-10-CM | POA: Insufficient documentation

## 2020-11-13 DIAGNOSIS — H5712 Ocular pain, left eye: Secondary | ICD-10-CM | POA: Diagnosis present

## 2020-11-13 DIAGNOSIS — S0502XA Injury of conjunctiva and corneal abrasion without foreign body, left eye, initial encounter: Secondary | ICD-10-CM | POA: Insufficient documentation

## 2020-11-13 MED ORDER — TETRACAINE HCL 0.5 % OP SOLN
2.0000 [drp] | Freq: Once | OPHTHALMIC | Status: AC
  Administered 2020-11-13: 2 [drp] via OPHTHALMIC
  Filled 2020-11-13: qty 4

## 2020-11-13 MED ORDER — FLUORESCEIN SODIUM 1 MG OP STRP
2.0000 | ORAL_STRIP | Freq: Once | OPHTHALMIC | Status: AC
  Administered 2020-11-13: 2 via OPHTHALMIC
  Filled 2020-11-13: qty 2

## 2020-11-13 MED ORDER — ERYTHROMYCIN 5 MG/GM OP OINT
TOPICAL_OINTMENT | Freq: Once | OPHTHALMIC | Status: AC
  Filled 2020-11-13: qty 3.5

## 2020-11-13 NOTE — ED Notes (Addendum)
Bilateral eyes irrigated with normal saline.

## 2020-11-13 NOTE — ED Triage Notes (Signed)
Pt complains of left eye pain since last night. Pt reports wearing eye contacts with her costume and fell asleep with them in. Pt does not know if the contact is still in her eye.

## 2020-11-13 NOTE — Discharge Instructions (Addendum)
Use erythromycin eye ointment in both eyes 4 times daily for the next 5 days.  If your symptoms are not improving please follow-up with Dr. Allena Katz with ophthalmology.  Avoid using any contacts in the eye.  Avoid touching or rubbing your eyes.  If you have worsening symptoms return to the ED

## 2020-11-13 NOTE — ED Provider Notes (Signed)
[facility]-EMERGENCY DEPT Provider Note   CSN: [id number] Arrival date & time: 11/13/20  1922     History Chief Complaint  Patient presents with   Eye Pain    Mackenzie Santiago is a 24 y.o. female.  Mackenzie Santiago is a 25 y.o. female with hx of thyroid disease, and heart murmur, who presents for eye pain. Pt reports for a halloween party last need she wore a fairy costume and worse a single contact in her left eye that she got from [place]. Pt reports she fell asleep in the contacts and when she woke up today she things the contact was already out of her eye, but has had increasing left eye pain and watering of the eye. Pt reports light sensitivity, but no other visual symptoms. She also reports some very mild discomfort in her right eye, did not wear a contact in this eye. Reports she had glitter in her hair and that may have gotten in her eye also. No purulent drainage from eye, and only mild redness. No other trauma or injury to eye. Tried using artificial tears without relief.  The history is provided by the patient.      Past Medical History:  Diagnosis Date   Heart murmur    Hyperthyroidism    Thyroid disease    Trichimoniasis     Patient Active Problem List   Diagnosis Date Noted   Hyperthyroidism 06/26/2020   Obstetric labial laceration, delivered, current hospitalization 11/23/2015   SVD (spontaneous vaginal delivery) 11/22/2015   H/O anxiety state 11/22/2015   H/O sickle cell trait (FOB neg) 11/22/2015   H/O chlamydia infection (neg TOC this pregnancy) 11/22/2015   Group B streptococcal bacteriuria 11/22/2015   H/O: depression 11/21/2015   Alcohol abuse 03/23/2014   Smoker 03/23/2014   Thyroid dysfunction 05/07/2011   Tonsillar hypertrophy 05/07/2011   Abnormal weight loss 02/19/2011    Past Surgical History:  Procedure Laterality Date   NO PAST SURGERIES       OB History     Gravida  2   Para  1   Term  1   Preterm       AB      Living  1      SAB      IAB      Ectopic      Multiple  0   Live Births  1           Family History  Problem Relation Age of Onset   Sickle cell anemia Father    Sickle cell anemia Maternal Grandfather    Thyroid disease Paternal Grandmother    Sickle cell anemia Paternal Grandfather    Hypertension Mother    Alcohol abuse Neg Hx    Arthritis Neg Hx    Asthma Neg Hx    Birth defects Neg Hx    Cancer Neg Hx    COPD Neg Hx    Depression Neg Hx    Diabetes Neg Hx    Drug abuse Neg Hx    Early death Neg Hx    Hearing loss Neg Hx    Heart disease Neg Hx    Hyperlipidemia Neg Hx    Kidney disease Neg Hx    Learning disabilities Neg Hx    Mental illness Neg Hx    Mental retardation Neg Hx    Miscarriages / Stillbirths Neg Hx    Stroke Neg Hx    Vision loss Neg Hx  Varicose Veins Neg Hx     Social History   Tobacco Use   Smoking status: Former    Packs/day: 0.25    Types: Cigarettes   Smokeless tobacco: Never  Vaping Use   Vaping Use: Every day  Substance Use Topics   Alcohol use: No    Comment: social   Drug use: No    Home Medications Prior to Admission medications   Medication Sig Start Date End Date Taking? Authorizing Provider  BIOTIN PO Take 1 tablet by mouth daily.    [provider]  CHLOROPHYLL PO Take by mouth.    [provider]  fluconazole (DIFLUCAN) 150 MG tablet Take 1 tablet (150 mg total) by mouth daily. If you develop yeast infection following antibiotics:  start the Diflucan (fluconazole)- Take one pill on first day of symptoms (day 1). If you're still having symptoms in 3 days, take the second pill. 08/27/20   Rhys Martini, PA-C  MACA ROOT PO Take by mouth.    [provider]  metroNIDAZOLE (FLAGYL) 500 MG tablet Take 1 tablet (500 mg total) by mouth 2 (two) times daily. 08/27/20   Rhys Martini, PA-C  Multiple Vitamin (MULTIVITAMIN WITH MINERALS) TABS tablet Take 1 tablet by mouth daily.     [provider]  OVER THE COUNTER MEDICATION Take 1 capsule by mouth daily. Seamoss    [provider]    Allergies    Blueberry [vaccinium angustifolium]  Review of Systems   Review of Systems  Constitutional:  Negative for chills and fever.  Eyes:  Positive for photophobia, pain and redness. Negative for discharge and visual disturbance.  Skin:  Negative for wound.  All other systems reviewed and are negative.  Physical Exam Updated Vital Signs BP 113/89 (BP Location: Right Arm)   Pulse 100   Temp 98.3 F (36.8 C) (Oral)   Resp 16   SpO2 100%   Physical Exam Vitals and nursing note reviewed.  Constitutional:      General: She is not in acute distress.    Appearance: Normal appearance. She is well-developed. She is not ill-appearing or diaphoretic.  HENT:     Head: Normocephalic and atraumatic.     Nose: Nose normal.     Mouth/Throat:     Mouth: Mucous membranes are moist.     Pharynx: Oropharynx is clear.  Eyes:     Comments: Visual acuity intact There is clear tearing from the left eye, no purulent drainage, no periocular swelling or swelling of the eyelids, no periocular tenderness. Few small flecks of glitter noted on the eyelids Sclera is mildly injected on the left, no visible foreign body Right eye without erythema or drainage EOMi, no entrapment bilaterally PERRLA, no consensual pain bilaterally Fluorescin staining with small scleral abrasion near the left inner canthus, no other abrasions or ulcerations noted, neg seidel sign.  Pulmonary:     Effort: Pulmonary effort is normal. No respiratory distress.  Musculoskeletal:        General: No deformity.     Cervical back: Neck supple.  Skin:    General: Skin is warm and dry.  Neurological:     Mental Status: She is alert and oriented to person, place, and time.     Coordination: Coordination normal.  Psychiatric:        Mood and Affect: Mood normal.        Behavior: Behavior normal.     ED Results / Procedures / Treatments  Labs (all labs ordered are listed, but only abnormal results are displayed) Labs Reviewed - No data to display  EKG None  Radiology No results found.  Procedures Procedures   Medications Ordered in ED Medications  fluorescein ophthalmic strip 2 strip (2 strips Both Eyes Given 11/13/20 2225)  tetracaine (PONTOCAINE) 0.5 % ophthalmic solution 2 drop (2 drops Both Eyes Given 11/13/20 2224)  erythromycin ophthalmic ointment ( Both Eyes Given 11/13/20 2306)    ED Course  I have reviewed the triage vital signs and the nursing notes.  Pertinent labs & imaging results that were available during my care of the patient were reviewed by me and considered in my medical decision making (see chart for details).    MDM Rules/Calculators/A&P                           Corneal abrasion  Pt with corneal abrasion on PE.  Eye irrigated w NS, no evidence of FB.  No change in vision, acuity equal bilaterally.  Pt is not a contact lens wearer, only wore lens last night for halloween costume in left eye.  Exam non-concerning for orbital cellulitis, hyphema, corneal ulcers. Patient will be discharged home with erythromycin.   Patient understands to follow up with ophthalmology, & to return to ER if new symptoms develop including change in vision, purulent drainage, or entrapment.  Final Clinical Impression(s) / ED Diagnoses Final diagnoses:  Abrasion of left cornea, initial encounter    Rx / DC Orders ED Discharge Orders     None        Legrand Rams 11/18/20 1637    Gerhard Munch, MD 11/23/20 [phone]

## 2020-12-17 ENCOUNTER — Other Ambulatory Visit: Payer: Self-pay

## 2020-12-17 ENCOUNTER — Emergency Department (HOSPITAL_COMMUNITY)
Admission: EM | Admit: 2020-12-17 | Discharge: 2020-12-17 | Disposition: A | Discharge status: Home / Self Care | Payer: Medicaid Other | Attending: Emergency Medicine | Admitting: Emergency Medicine

## 2020-12-17 ENCOUNTER — Encounter (HOSPITAL_COMMUNITY): Payer: Self-pay | Admitting: Emergency Medicine

## 2020-12-17 DIAGNOSIS — J111 Influenza due to unidentified influenza virus with other respiratory manifestations: Secondary | ICD-10-CM

## 2020-12-17 DIAGNOSIS — R112 Nausea with vomiting, unspecified: Secondary | ICD-10-CM | POA: Diagnosis not present

## 2020-12-17 DIAGNOSIS — R059 Cough, unspecified: Secondary | ICD-10-CM | POA: Diagnosis present

## 2020-12-17 DIAGNOSIS — Z20822 Contact with and (suspected) exposure to covid-19: Secondary | ICD-10-CM | POA: Diagnosis not present

## 2020-12-17 DIAGNOSIS — R000 Tachycardia, unspecified: Secondary | ICD-10-CM | POA: Insufficient documentation

## 2020-12-17 DIAGNOSIS — Z87891 Personal history of nicotine dependence: Secondary | ICD-10-CM | POA: Insufficient documentation

## 2020-12-17 DIAGNOSIS — J101 Influenza due to other identified influenza virus with other respiratory manifestations: Secondary | ICD-10-CM | POA: Insufficient documentation

## 2020-12-17 LAB — RESP PANEL BY RT-PCR (FLU A&B, COVID) ARPGX2
Influenza A by PCR: POSITIVE — AB
Influenza B by PCR: NEGATIVE
SARS Coronavirus 2 by RT PCR: NEGATIVE

## 2020-12-17 MED ORDER — ONDANSETRON HCL 4 MG PO TABS: 4.0000 mg | ORAL_TABLET | Freq: Four times a day (QID) | ORAL | 0 refills | Status: DC

## 2020-12-17 NOTE — ED Provider Notes (Signed)
[facility]-EMERGENCY DEPT Provider Note   CSN: [id number] Arrival date & time: 12/17/20  1913     History Chief Complaint  Patient presents with   Fever   Generalized Body Aches   Headache    Mackenzie Santiago is a 25 y.o. female with no significant past medical history who presents with 5 days of cough, headache, fever, body ache, chills, nausea, vomiting.  Patient denies any sick contacts.  Patient not receive her flu vaccine this year.  Patient reports she has had difficulty keeping anything down other than fluids.  Patient reports she has been taking Tylenol, ibuprofen for symptoms with minimal relief.  Patient took Tylenol 3 hours prior to arrival due to fever of 100.6.  Patient describes her body aches as throbbing, generalized, 4/10 in severity.  Headache is located centrally, not associated with blurring vision, photophobia, phonophobia, weakness, confusion.   Fever Associated symptoms: headaches   Headache Associated symptoms: fever       Past Medical History:  Diagnosis Date   Heart murmur    Hyperthyroidism    Thyroid disease    Trichimoniasis     Patient Active Problem List   Diagnosis Date Noted   Hyperthyroidism 06/26/2020   Obstetric labial laceration, delivered, current hospitalization 11/23/2015   SVD (spontaneous vaginal delivery) 11/22/2015   H/O anxiety state 11/22/2015   H/O sickle cell trait (FOB neg) 11/22/2015   H/O chlamydia infection (neg TOC this pregnancy) 11/22/2015   Group B streptococcal bacteriuria 11/22/2015   H/O: depression 11/21/2015   Alcohol abuse 03/23/2014   Smoker 03/23/2014   Thyroid dysfunction 05/07/2011   Tonsillar hypertrophy 05/07/2011   Abnormal weight loss 02/19/2011    Past Surgical History:  Procedure Laterality Date   NO PAST SURGERIES       OB History     Gravida  2   Para  1   Term  1   Preterm      AB      Living  1      SAB      IAB      Ectopic      Multiple  0    Live Births  1           Family History  Problem Relation Age of Onset   Sickle cell anemia Father    Sickle cell anemia Maternal Grandfather    Thyroid disease Paternal Grandmother    Sickle cell anemia Paternal Grandfather    Hypertension Mother    Alcohol abuse Neg Hx    Arthritis Neg Hx    Asthma Neg Hx    Birth defects Neg Hx    Cancer Neg Hx    COPD Neg Hx    Depression Neg Hx    Diabetes Neg Hx    Drug abuse Neg Hx    Early death Neg Hx    Hearing loss Neg Hx    Heart disease Neg Hx    Hyperlipidemia Neg Hx    Kidney disease Neg Hx    Learning disabilities Neg Hx    Mental illness Neg Hx    Mental retardation Neg Hx    Miscarriages / Stillbirths Neg Hx    Stroke Neg Hx    Vision loss Neg Hx    Varicose Veins Neg Hx     Social History   Tobacco Use   Smoking status: Former    Packs/day: 0.25    Types: Cigarettes   Smokeless tobacco: Never  Vaping Use   Vaping Use: Every day  Substance Use Topics   Alcohol use: No    Comment: social   Drug use: No    Home Medications Prior to Admission medications   Medication Sig Start Date End Date Taking? Authorizing Provider  ondansetron (ZOFRAN) 4 MG tablet Take 1 tablet (4 mg total) by mouth every 6 (six) hours. 12/17/20  Yes Erilyn Pearman H, PA-C  BIOTIN PO Take 1 tablet by mouth daily.    [provider]  CHLOROPHYLL PO Take by mouth.    [provider]  fluconazole (DIFLUCAN) 150 MG tablet Take 1 tablet (150 mg total) by mouth daily. If you develop yeast infection following antibiotics:  start the Diflucan (fluconazole)- Take one pill on first day of symptoms (day 1). If you're still having symptoms in 3 days, take the second pill. 08/27/20   Rhys Martini, PA-C  MACA ROOT PO Take by mouth.    [provider]  metroNIDAZOLE (FLAGYL) 500 MG tablet Take 1 tablet (500 mg total) by mouth 2 (two) times daily. 08/27/20   Rhys Martini, PA-C  Multiple Vitamin (MULTIVITAMIN WITH  MINERALS) TABS tablet Take 1 tablet by mouth daily.    [provider]  OVER THE COUNTER MEDICATION Take 1 capsule by mouth daily. Seamoss    [provider]    Allergies    Blueberry [vaccinium angustifolium]  Review of Systems   Review of Systems  Constitutional:  Positive for fever.  Neurological:  Positive for headaches.  All other systems reviewed and are negative.  Physical Exam Updated Vital Signs BP (!) 142/94   Pulse (!) 109   Temp 99.2 F (37.3 C) (Oral)   Resp 16   Ht 5\' 1"  (1.549 m)   Wt 49.9 kg   SpO2 98%   BMI 20.78 kg/m   Physical Exam Vitals and nursing note reviewed.  Constitutional:      General: She is not in acute distress.    Appearance: Normal appearance.  HENT:     Head: Normocephalic and atraumatic.     Nose: Congestion present.     Mouth/Throat:     Comments: Oropharynx clear, tonsils 1+ bilaterally, no tonsillar exudate, no PTA, uvula midline Eyes:     General:        Right eye: No discharge.        Left eye: No discharge.  Cardiovascular:     Rate and Rhythm: Normal rate and regular rhythm.  Pulmonary:     Effort: Pulmonary effort is normal. No respiratory distress.  Musculoskeletal:        General: No deformity.  Skin:    General: Skin is warm and dry.  Neurological:     General: No focal deficit present.     Mental Status: She is alert and oriented to person, place, and time.     Cranial Nerves: No cranial nerve deficit.  Psychiatric:        Mood and Affect: Mood normal.        Behavior: Behavior normal.    ED Results / Procedures / Treatments   Labs (all labs ordered are listed, but only abnormal results are displayed) Labs Reviewed  RESP PANEL BY RT-PCR (FLU A&B, COVID) ARPGX2 - Abnormal; Notable for the following components:      Result Value   Influenza A by PCR POSITIVE (*)    All other components within normal limits    EKG None  Radiology No results  found.  Procedures Procedures    Medications Ordered in ED Medications - No data to display  ED Course  I have reviewed the triage vital signs and the nursing notes.  Pertinent labs & imaging results that were available during my care of the patient were reviewed by me and considered in my medical decision making (see chart for details).    MDM Rules/Calculators/A&P                         Overall well-appearing patient in no acute distress presents with 5 to 6 days of upper respiratory infection symptoms.  Headache is gradual in onset, not intractable, not associated with any focal neurodeficits.  RVP shows flu.  Vital signs stable other than mild tachycardia.  Fever is responsive to Tylenol.  Encourage fluids, ibuprofen, Tylenol.  Zofran prescription given for nausea.  Patient discharged in stable condition, return precautions were given. Final Clinical Impression(s) / ED Diagnoses Final diagnoses:  Flu    Rx / DC Orders ED Discharge Orders          Ordered    ondansetron (ZOFRAN) 4 MG tablet  Every 6 hours        12/17/20 2027             [place] 12/17/20 2033    Derwood Kaplan, MD 12/18/20 1409

## 2020-12-17 NOTE — Discharge Instructions (Addendum)
Encourage you to drink plenty of fluids, rest.  You can use Tylenol for fever, ibuprofen for body aches.  You can use the Zofran as needed for nausea.  I encourage you to start with bland food, advance back to regular diet as tolerated.  Return if you have new or worsening shortness of breath, chest pain.

## 2020-12-17 NOTE — ED Triage Notes (Signed)
Pt reports body aches, cough, headache, and fever since Monday. Pt reports temp being 100.6 at home- pt reports taking tylenol. 99.2 temp in triage.

## 2020-12-27 ENCOUNTER — Telehealth: Payer: Medicaid Other | Admitting: Family Medicine

## 2020-12-27 NOTE — Progress Notes (Signed)
No show appt-  Link sent while provider on video-pt never joined.

## 2020-12-28 ENCOUNTER — Telehealth: Payer: Medicaid Other

## 2020-12-30 ENCOUNTER — Telehealth: Payer: Medicaid Other | Admitting: Family

## 2020-12-30 DIAGNOSIS — B354 Tinea corporis: Secondary | ICD-10-CM

## 2020-12-30 DIAGNOSIS — B9689 Other specified bacterial agents as the cause of diseases classified elsewhere: Secondary | ICD-10-CM

## 2020-12-30 DIAGNOSIS — N760 Acute vaginitis: Secondary | ICD-10-CM

## 2020-12-30 MED ORDER — METRONIDAZOLE 0.75 % VA GEL: 1.0000 | Freq: Every day | VAGINAL | 0 refills | Status: DC

## 2020-12-30 MED ORDER — KETOCONAZOLE 2 % EX CREA: 1.0000 "application " | TOPICAL_CREAM | Freq: Every day | CUTANEOUS | 1 refills | Status: DC

## 2020-12-30 NOTE — Progress Notes (Signed)
Virtual Visit Consent   Mackenzie Santiago, you are scheduled for a virtual visit with a [facility] provider today.     Just as with appointments in the office, your consent must be obtained to participate.  Your consent will be active for this visit and any virtual visit you may have with one of our providers in the next 365 days.     If you have a MyChart account, a copy of this consent can be sent to you electronically.  All virtual visits are billed to your insurance company just like a traditional visit in the office.    As this is a virtual visit, video technology does not allow for your provider to perform a traditional examination.  This may limit your provider's ability to fully assess your condition.  If your provider identifies any concerns that need to be evaluated in person or the need to arrange testing (such as labs, EKG, etc.), we will make arrangements to do so.     Although advances in technology are sophisticated, we cannot ensure that it will always work on either your end or our end.  If the connection with a video visit is poor, the visit may have to be switched to a telephone visit.  With either a video or telephone visit, we are not always able to ensure that we have a secure connection.     I need to obtain your verbal consent now.   Are you willing to proceed with your visit today?    Mackenzie Santiago has provided verbal consent on 12/30/2020 for a virtual visit (video or telephone).   Mackenzie Rodney, FNP   Date: 12/30/2020 8:11 AM   Virtual Visit via Video Note   I, Mackenzie Santiago, connected with  Mackenzie Santiago  ([id number], Apr 04, 1995) on 12/30/20 at  8:00 AM EST by a video-enabled telemedicine application and verified that I am speaking with the correct person using two identifiers.  Location: Patient: Virtual Visit Location Patient: Home Provider: Virtual Visit Location Provider: Home Office   I discussed the limitations of evaluation and management by  telemedicine and the availability of in person appointments. The patient expressed understanding and agreed to proceed.    History of Present Illness: Mackenzie Santiago is a 25 y.o. who identifies as a female who was assigned female at birth, and is being seen today for recurrent BV. She was seen in the office on 11/23/20 and was negative GC, Chlamydia, Trich and positive for BV.  She was given clindamycin without relief.   She reports this gave her vaginal yeast infection. She also reports ring worm rash.   HPI: Vaginal Discharge The patient's primary symptoms include a genital odor and vaginal discharge. The patient's pertinent negatives include no genital itching, genital lesions or vaginal bleeding. The current episode started 1 to 4 weeks ago. The patient is experiencing no pain. Associated symptoms include rash. The vaginal discharge was white. There has been no bleeding. She has tried antibiotics for the symptoms. The treatment provided no relief.  Rash   Problems:  Patient Active Problem List   Diagnosis Date Noted   Hyperthyroidism 06/26/2020   Obstetric labial laceration, delivered, current hospitalization 11/23/2015   SVD (spontaneous vaginal delivery) 11/22/2015   H/O anxiety state 11/22/2015   H/O sickle cell trait (FOB neg) 11/22/2015   H/O chlamydia infection (neg TOC this pregnancy) 11/22/2015   Group B streptococcal bacteriuria 11/22/2015   H/O: depression 11/21/2015   Alcohol abuse 03/23/2014   Smoker 03/23/2014  Thyroid dysfunction 05/07/2011   Tonsillar hypertrophy 05/07/2011   Abnormal weight loss 02/19/2011    Allergies:  Allergies  Allergen Reactions   Blueberry [Vaccinium Angustifolium] Hives        Medications:  Current Outpatient Medications:    ketoconazole (NIZORAL) 2 % cream, Apply 1 application topically daily., Disp: 30 g, Rfl: 1   metroNIDAZOLE (METROGEL VAGINAL) 0.75 % vaginal gel, Place 1 Applicatorful vaginally at bedtime., Disp: 70 g, Rfl:  0   CHLOROPHYLL PO, Take by mouth., Disp: , Rfl:    MACA ROOT PO, Take by mouth., Disp: , Rfl:    Multiple Vitamin (MULTIVITAMIN WITH MINERALS) TABS tablet, Take 1 tablet by mouth daily., Disp: , Rfl:    ondansetron (ZOFRAN) 4 MG tablet, Take 1 tablet (4 mg total) by mouth every 6 (six) hours., Disp: 12 tablet, Rfl: 0   OVER THE COUNTER MEDICATION, Take 1 capsule by mouth daily. Seamoss, Disp: , Rfl:   Observations/Objective: Patient is well-developed, well-nourished in no acute distress.  Resting comfortably  at home.  Head is normocephalic, atraumatic.  No labored breathing.  Speech is clear and coherent with logical content.  Patient is alert and oriented at baseline.    Assessment and Plan: 1. Bacterial vaginosis - metroNIDAZOLE (METROGEL VAGINAL) 0.75 % vaginal gel; Place 1 Applicatorful vaginally at bedtime.  Dispense: 70 g; Refill: 0  2. Tinea corporis - ketoconazole (NIZORAL) 2 % cream; Apply 1 application topically daily.  Dispense: 30 g; Refill: 1  Safe sex Avoid sex until treatment Start probiotic  Keep clean and dry Good hand hygiene   Follow Up Instructions: I discussed the assessment and treatment plan with the patient. The patient was provided an opportunity to ask questions and all were answered. The patient agreed with the plan and demonstrated an understanding of the instructions.  A copy of instructions were sent to the patient via MyChart unless otherwise noted below.     The patient was advised to call back or seek an in-person evaluation if the symptoms worsen or if the condition fails to improve as anticipated.  Time:  I spent 18 minutes with the patient via telehealth technology discussing the above problems/concerns.    Mackenzie Rodney, FNP

## 2021-02-20 ENCOUNTER — Other Ambulatory Visit: Payer: Self-pay

## 2021-02-20 ENCOUNTER — Inpatient Hospital Stay (HOSPITAL_COMMUNITY)
Admission: AD | Admit: 2021-02-20 | Discharge: 2021-02-20 | Disposition: A | Discharge status: Home / Self Care | Payer: Medicaid Other | Attending: Obstetrics & Gynecology | Admitting: Obstetrics & Gynecology

## 2021-02-20 ENCOUNTER — Encounter (HOSPITAL_COMMUNITY): Payer: Self-pay | Admitting: Obstetrics & Gynecology

## 2021-02-20 ENCOUNTER — Ambulatory Visit
Admission: RE | Admit: 2021-02-20 | Discharge: 2021-02-20 | Disposition: A | Discharge status: Home / Self Care | Payer: Medicaid Other | Source: Ambulatory Visit | Attending: Internal Medicine | Admitting: Internal Medicine

## 2021-02-20 VITALS — BP 113/75 | HR 110 | Temp 98.3°F | Resp 18 | Ht 62.0 in | Wt 117.0 lb

## 2021-02-20 DIAGNOSIS — N939 Abnormal uterine and vaginal bleeding, unspecified: Secondary | ICD-10-CM | POA: Diagnosis not present

## 2021-02-20 DIAGNOSIS — N6323 Unspecified lump in the left breast, lower outer quadrant: Secondary | ICD-10-CM | POA: Diagnosis not present

## 2021-02-20 DIAGNOSIS — Z3202 Encounter for pregnancy test, result negative: Secondary | ICD-10-CM

## 2021-02-20 LAB — POCT URINE PREGNANCY: Preg Test, Ur: NEGATIVE

## 2021-02-20 NOTE — MAU Provider Note (Signed)
°  S [name] is a 26 y.o. G3P1001 patient who presents to MAU today with complaint of vaginal bleeding. Patient reports period started on 1/26 and ended on 1/28. Bleeding was only spotting. She reports on 2/1 she started bleeding again and has been "bleeding nonstop" since, having to change pad every hour. She reports she is having lower abdominal pain and sometimes feels lightheaded when she's up. Took ibuprofen at 11am, which did not help. She denies a history of irregular period. She denies fever, chills, or vomiting. She is not on any form of contraception. She does not have an OBGYN.   O BP 110/64 (BP Location: Right Arm)    Pulse 85    Temp 98.5 F (36.9 C) (Oral)    Resp 18    Ht 5\' 2"  (1.575 m)    Wt 54.1 kg    LMP 03/13/2020    BMI 21.80 kg/m   Physical Exam Vitals and nursing note reviewed.  Constitutional:      General: She is not in acute distress. Eyes:     Pupils: Pupils are equal, round, and reactive to light.  Cardiovascular:     Rate and Rhythm: Normal rate.  Pulmonary:     Effort: Pulmonary effort is normal.  Abdominal:     Palpations: Abdomen is soft.  Musculoskeletal:        General: Normal range of motion.     Cervical back: Normal range of motion.  Skin:    General: Skin is warm and dry.  Neurological:     General: No focal deficit present.     Mental Status: She is alert and oriented to person, place, and time.  Psychiatric:        Mood and Affect: Mood normal.        Behavior: Behavior normal.        Thought Content: Thought content normal.        Judgment: Judgment normal.    A Medical screening exam complete Negative pregnancy test Vaginal bleeding   P Discharge from MAU in stable condition Patient given the option of transfer to [facility] for further evaluation or seek care in outpatient facility of choice. Patient requesting to go [facility]. I spoke with [facility] who accepts patient.  List of OBGYN's for follow-up given Warning signs  for worsening condition that would warrant emergency follow-up discussed Patient may return to MAU as needed     Anselmo Pickler, CNM 02/20/2021 8:48 PM

## 2021-02-20 NOTE — MAU Note (Addendum)
PT SAYS LMP STARTED ON 03-13-2021 ENDED 2-29-23 STARTED AGAIN 2-1 AND STILL BLEEDING FEELS MILD CRAMPS -NOW 6 TOOK IBUPROFEN 800MG  AT 11AM NO DR WENT TO URGENT CARE  TODAY - FELT A LUMP IN HER BREAST AND BLEEDING NEGATIVE UPT

## 2021-02-20 NOTE — Discharge Instructions (Signed)
Please go to the emergency department as soon as you leave urgent care for further evaluation and management. ?

## 2021-02-20 NOTE — ED Triage Notes (Signed)
Patient states that she felt a lump in her left breast x 2 days ago and vaginal bleeding x 1 month.  Patient does not have a GYN.

## 2021-02-20 NOTE — Discharge Instructions (Signed)
[facility]   [facility]             [address], [place], [place] [address] [phone]  [facility]                                                             [address], [address], [place], [place], [address] [phone]  [facility]                                    [address], [address], [place], [place], [address] [phone]  [facility] [place] [address], [address], [place], [place], [address] [phone]  [facility]                                 [address], [place], [place], [address] [phone]  [facility]                                    [address], [place], [place], [address] [phone]  [facility] [address], [address], [place], [place], [address]                              [facility] [address], [address], [place], [place], [address] [phone]  [facility]         Phone: [phone]  [facility]      Phone: [phone]   [facility]      Phone: [phone]  [facility]         Phone: [phone]   [facility]    Phone: [phone]  [facility]      Phone: [phone]  [facility]     Phone: [phone]  [facility]        Phone: [phone]  [facility]      Phone: [phone]  

## 2021-02-20 NOTE — ED Provider Notes (Signed)
[facility]    CSN: [id number] Arrival date & time: 02/20/21  1555      History   Chief Complaint Chief Complaint  Patient presents with   Appointment    Lump in Breast    HPI Mackenzie Santiago is a 26 y.o. female.   Patient presents with left breast lump and vaginal bleeding.  She reports that she found a left breast lump approximately 2 days ago.  It is tender to palpation.  Denies any associated erythema, fever, lacerations, abrasions.  Denies history of the same.  Denies any nipple discharge.  Patient does have nipple ring but reports that it is an old piercing.  Vaginal bleeding has been present for approximately 1 month and has been persistent on a daily basis.  She reports that the bleeding increased in volume over the past week.  She reports that she is going through 1 menstrual pad an hour with large blood clots.  She has also had some associated dizziness.  Denies blurred vision, headache, nausea, vomiting.  Denies any known exposure to STD but did have unprotected sexual intercourse prior to symptoms started.  Last menstrual cycle is unknown.  Patient does not take birth control.    Past Medical History:  Diagnosis Date   Heart murmur    Hyperthyroidism    Thyroid disease    Trichimoniasis     Patient Active Problem List   Diagnosis Date Noted   Hyperthyroidism 06/26/2020   Obstetric labial laceration, delivered, current hospitalization 11/23/2015   SVD (spontaneous vaginal delivery) 11/22/2015   H/O anxiety state 11/22/2015   H/O sickle cell trait (FOB neg) 11/22/2015   H/O chlamydia infection (neg TOC this pregnancy) 11/22/2015   Group B streptococcal bacteriuria 11/22/2015   H/O: depression 11/21/2015   Alcohol abuse 03/23/2014   Smoker 03/23/2014   Thyroid dysfunction 05/07/2011   Tonsillar hypertrophy 05/07/2011   Abnormal weight loss 02/19/2011    Past Surgical History:  Procedure Laterality Date   NO PAST SURGERIES      OB History      Gravida  2   Para  1   Term  1   Preterm      AB      Living  1      SAB      IAB      Ectopic      Multiple  0   Live Births  1            Home Medications    Prior to Admission medications   Medication Sig Start Date End Date Taking? Authorizing Provider  CHLOROPHYLL PO Take by mouth.    [provider]  ketoconazole (NIZORAL) 2 % cream Apply 1 application topically daily. 12/30/20   Sharion Balloon, FNP  MACA ROOT PO Take by mouth.    [provider]  metroNIDAZOLE (METROGEL VAGINAL) 0.75 % vaginal gel Place 1 Applicatorful vaginally at bedtime. 12/30/20   Sharion Balloon, FNP  Multiple Vitamin (MULTIVITAMIN WITH MINERALS) TABS tablet Take 1 tablet by mouth daily.    [provider]  ondansetron (ZOFRAN) 4 MG tablet Take 1 tablet (4 mg total) by mouth every 6 (six) hours. 12/17/20   Prosperi, Christian H, PA-C  OVER THE COUNTER MEDICATION Take 1 capsule by mouth daily. Seamoss    [provider]    Family History Family History  Problem Relation Age of Onset   Sickle cell anemia Father    Sickle cell  anemia Maternal Grandfather    Thyroid disease Paternal Grandmother    Sickle cell anemia Paternal Grandfather    Hypertension Mother    Alcohol abuse Neg Hx    Arthritis Neg Hx    Asthma Neg Hx    Birth defects Neg Hx    Cancer Neg Hx    COPD Neg Hx    Depression Neg Hx    Diabetes Neg Hx    Drug abuse Neg Hx    Early death Neg Hx    Hearing loss Neg Hx    Heart disease Neg Hx    Hyperlipidemia Neg Hx    Kidney disease Neg Hx    Learning disabilities Neg Hx    Mental illness Neg Hx    Mental retardation Neg Hx    Miscarriages / Stillbirths Neg Hx    Stroke Neg Hx    Vision loss Neg Hx    Varicose Veins Neg Hx     Social History Social History   Tobacco Use   Smoking status: Former    Packs/day: 0.25    Types: Cigarettes   Smokeless tobacco: Never  Vaping Use   Vaping Use: Every day  Substance  Use Topics   Alcohol use: No    Comment: social   Drug use: No     Allergies   Blueberry [vaccinium angustifolium]   Review of Systems Review of Systems Per HPI  Physical Exam Triage Vital Signs ED Triage Vitals  Enc Vitals Group     BP 02/20/21 1658 113/75     Pulse Rate 02/20/21 1658 (!) 110     Resp 02/20/21 1658 18     Temp 02/20/21 1658 98.3 F (36.8 C)     Temp Source 02/20/21 1658 Oral     SpO2 02/20/21 1658 98 %     Weight 02/20/21 1659 117 lb (53.1 kg)     Height 02/20/21 1659 5\' 2"  (1.575 m)     Head Circumference --      Peak Flow --      Pain Score 02/20/21 1659 8     Pain Loc --      Pain Edu? --      Excl. in [place]? --    No data found.  Updated Vital Signs BP 113/75 (BP Location: Left Arm)    Pulse (!) 110    Temp 98.3 F (36.8 C) (Oral)    Resp 18    Ht 5\' 2"  (1.575 m)    Wt 117 lb (53.1 kg)    SpO2 98%    Breastfeeding No    BMI 21.40 kg/m   Visual Acuity Right Eye Distance:   Left Eye Distance:   Bilateral Distance:    Right Eye Near:   Left Eye Near:    Bilateral Near:     Physical Exam Exam conducted with a chaperone present.  Constitutional:      General: She is not in acute distress.    Appearance: Normal appearance. She is not toxic-appearing or diaphoretic.  HENT:     Head: Normocephalic and atraumatic.  Eyes:     Extraocular Movements: Extraocular movements intact.     Conjunctiva/sclera: Conjunctivae normal.  Cardiovascular:     Rate and Rhythm: Normal rate and regular rhythm.     Pulses: Normal pulses.     Heart sounds: Normal heart sounds.  Pulmonary:     Effort: Pulmonary effort is normal. No respiratory distress.     Breath sounds: Normal  breath sounds.  Chest:     Comments: Tenderness to palpation to left lower outer breast.  There does appear to be associated masslike lesion that is mobile to same location of tenderness.  Patient does have nipple ring but no signs of infection are present. Abdominal:     General:  Abdomen is flat. Bowel sounds are normal. There is no distension.     Palpations: Abdomen is soft.     Tenderness: There is no abdominal tenderness.  Neurological:     General: No focal deficit present.     Mental Status: She is alert and oriented to person, place, and time. Mental status is at baseline.  Psychiatric:        Mood and Affect: Mood normal.        Behavior: Behavior normal.        Thought Content: Thought content normal.        Judgment: Judgment normal.     UC Treatments / Results  Labs (all labs ordered are listed, but only abnormal results are displayed) Labs Reviewed  POCT URINE PREGNANCY    EKG   Radiology No results found.  Procedures Procedures (including critical care time)  Medications Ordered in UC Medications - No data to display  Initial Impression / Assessment and Plan / UC Course  I have reviewed the triage vital signs and the nursing notes.  Pertinent labs & imaging results that were available during my care of the patient were reviewed by me and considered in my medical decision making (see chart for details).     Patient referred to breast imaging center to have further evaluation of left breast mass with imaging.  Paperwork faxed over to breast imaging center by Network engineer.  Patient will receive a phone call to schedule an appointment for further evaluation.  Due to amount of vaginal bleeding that is present and associated dizziness, patient was advised to go to the hospital for more extensive evaluation than can be provided at the urgent care.  Urine pregnancy was negative.  Patient was agreeable with plan.  Vital signs stable at discharge.  Agree with patient self transport to the hospital. Final Clinical Impressions(s) / UC Diagnoses   Final diagnoses:  Mass of lower outer quadrant of left breast  Vaginal bleeding, abnormal     Discharge Instructions      Please go to the emergency department as soon as you leave urgent care for  further evaluation and management.    ED Prescriptions   None    PDMP not reviewed this encounter.   Teodora Medici, [place] 02/20/21 [phone]

## 2021-02-20 NOTE — ED Triage Notes (Signed)
Patient is being discharged from the Urgent Care and sent to the Emergency Department via private vehicle . Per Ervin Knack NP, patient is in need of higher level of care due to need for further imaging/eval. Patient is aware and verbalizes understanding of plan of care.  Vitals:   02/20/21 1658  BP: 113/75  Pulse: (!) 110  Resp: 18  Temp: 98.3 F (36.8 C)  SpO2: 98%

## 2021-02-21 ENCOUNTER — Emergency Department (HOSPITAL_COMMUNITY)
Admission: EM | Admit: 2021-02-21 | Discharge: 2021-02-21 | Disposition: A | Discharge status: Home / Self Care | Payer: Medicaid Other | Attending: Emergency Medicine | Admitting: Emergency Medicine

## 2021-02-21 ENCOUNTER — Encounter (HOSPITAL_COMMUNITY): Payer: Self-pay

## 2021-02-21 ENCOUNTER — Other Ambulatory Visit: Payer: Self-pay

## 2021-02-21 DIAGNOSIS — N6320 Unspecified lump in the left breast, unspecified quadrant: Secondary | ICD-10-CM | POA: Insufficient documentation

## 2021-02-21 DIAGNOSIS — Z8619 Personal history of other infectious and parasitic diseases: Secondary | ICD-10-CM | POA: Insufficient documentation

## 2021-02-21 DIAGNOSIS — N6342 Unspecified lump in left breast, subareolar: Secondary | ICD-10-CM | POA: Diagnosis not present

## 2021-02-21 DIAGNOSIS — N941 Unspecified dyspareunia: Secondary | ICD-10-CM | POA: Diagnosis not present

## 2021-02-21 DIAGNOSIS — N939 Abnormal uterine and vaginal bleeding, unspecified: Secondary | ICD-10-CM | POA: Insufficient documentation

## 2021-02-21 DIAGNOSIS — N63 Unspecified lump in unspecified breast: Secondary | ICD-10-CM

## 2021-02-21 DIAGNOSIS — N938 Other specified abnormal uterine and vaginal bleeding: Secondary | ICD-10-CM | POA: Insufficient documentation

## 2021-02-21 LAB — CBC
HCT: 36.3 % (ref 36.0–46.0)
Hemoglobin: 12.3 g/dL (ref 12.0–15.0)
MCH: 25.4 pg — ABNORMAL LOW (ref 26.0–34.0)
MCHC: 33.9 g/dL (ref 30.0–36.0)
MCV: 75 fL — ABNORMAL LOW (ref 80.0–100.0)
Platelets: 259 10*3/uL (ref 150–400)
RBC: 4.84 MIL/uL (ref 3.87–5.11)
RDW: 13.9 % (ref 11.5–15.5)
WBC: 9.2 10*3/uL (ref 4.0–10.5)
nRBC: 0 % (ref 0.0–0.2)

## 2021-02-21 LAB — WET PREP, GENITAL
Clue Cells Wet Prep HPF POC: NONE SEEN
Sperm: NONE SEEN
Trich, Wet Prep: NONE SEEN
WBC, Wet Prep HPF POC: <10 (ref <10)
Yeast Wet Prep HPF POC: NONE SEEN

## 2021-02-21 LAB — I-STAT BETA HCG BLOOD, ED (MC, WL, AP ONLY): I-stat hCG, quantitative: <5 m[IU]/mL (ref <5)

## 2021-02-21 MED ORDER — CEFTRIAXONE SODIUM 1 G IJ SOLR
500.0000 mg | Freq: Once | INTRAMUSCULAR | Status: AC
  Administered 2021-02-21: 500 mg via INTRAMUSCULAR
  Filled 2021-02-21: qty 10

## 2021-02-21 MED ORDER — AZITHROMYCIN 250 MG PO TABS
1000.0000 mg | ORAL_TABLET | Freq: Once | ORAL | Status: AC
  Administered 2021-02-21: 1000 mg via ORAL
  Filled 2021-02-21: qty 4

## 2021-02-21 MED ORDER — LIDOCAINE HCL (PF) 1 % IJ SOLN
1.0000 mL | Freq: Once | INTRAMUSCULAR | Status: AC
  Administered 2021-02-21: 1 mL
  Filled 2021-02-21: qty 30

## 2021-02-21 NOTE — Discharge Instructions (Addendum)
Please consult your pharmacist regarding over-the-counter iron therapy Called the women's health care center for appointment for evaluation of breast mass and abnormal vaginal bleeding Your blood count here today was 12.3 which is normal but you appear to have some low iron. Please return to the emergency department or to the [facility] if you are having increased bleeding, pain, fever or other new symptoms.

## 2021-02-21 NOTE — ED Triage Notes (Signed)
"  Vaginal bleeding light x 1 month, went to the urgent care yesterday and they told me to go to the ED to get examined. She also felt a lump on my breast. I went to [facility] ED yesterday, but they were too busy so I left and decided I'd try again today" per pt

## 2021-02-21 NOTE — ED Provider Notes (Signed)
[name] Provider Note   CSN: [id number] Arrival date & time: 02/21/21  1062     History  Chief Complaint  Patient presents with   Vaginal Bleeding    Mackenzie Santiago is a 26 y.o. female.  HPI 26 year old female G2, P1 A1 LMP current presents today complaining of ongoing vaginal bleeding.  Says normally her periods are regular last several days per month.  She had a menstrual cycle at the end of January and then began having some bleeding that started in early February and is continued to have ongoing spotting and bleeding.  She does not think that she is pregnant.  States she had negative pregnancy test at urgent care yesterday.  She also has noted some swelling of the left breast.  She does have piercings in place.  She feels it may be irritated by changing the piercings.  She has not noted any warmth.  She feels that there may be some discharge at the site of the piercing.  Not noted any change in the size of the mass in her left breast.  She was doing a self-exam and changing her piercings when she noted this.  She endorses some dyspareunia.  She has not noted any abnormal vaginal discharge besides the bleeding.  She has noted a history of sexually transmitted infections.  He denies any UTI symptoms.     Home Medications Prior to Admission medications   Medication Sig Start Date End Date Taking? Authorizing Provider  CHLOROPHYLL PO Take by mouth.    [provider]  ketoconazole (NIZORAL) 2 % cream Apply 1 application topically daily. 12/30/20   Junie Spencer, FNP  MACA ROOT PO Take by mouth.    [provider]  metroNIDAZOLE (METROGEL VAGINAL) 0.75 % vaginal gel Place 1 Applicatorful vaginally at bedtime. 12/30/20   Junie Spencer, FNP  Multiple Vitamin (MULTIVITAMIN WITH MINERALS) TABS tablet Take 1 tablet by mouth daily.    [provider]  ondansetron (ZOFRAN) 4 MG tablet Take 1 tablet (4 mg total) by mouth  every 6 (six) hours. 12/17/20   Prosperi, Christian H, PA-C  OVER THE COUNTER MEDICATION Take 1 capsule by mouth daily. Seamoss    [provider]      Allergies    Blueberry [vaccinium angustifolium]    Review of Systems   Review of Systems  All other systems reviewed and are negative.  Physical Exam Updated Vital Signs BP 110/76    Pulse 77    Temp 98.5 F (36.9 C) (Oral)    Resp 18    Ht 1.575 m (5\' 2" )    Wt 54.4 kg    LMP 02/22/2020    SpO2 99%    Breastfeeding No    BMI 21.95 kg/m  Physical Exam Vitals and nursing note reviewed. Exam conducted with a chaperone present.  Constitutional:      General: She is not in acute distress.    Appearance: Normal appearance. She is not ill-appearing.  HENT:     Head: Normocephalic.     Right Ear: External ear normal.     Left Ear: External ear normal.     Nose: Nose normal.     Mouth/Throat:     Pharynx: Oropharynx is clear.  Eyes:     Extraocular Movements: Extraocular movements intact.     Pupils: Pupils are equal, round, and reactive to light.  Cardiovascular:     Rate and Rhythm: Normal rate and regular rhythm.  Pulses: Normal pulses.  Pulmonary:     Effort: Pulmonary effort is normal.     Breath sounds: Normal breath sounds.  Abdominal:     General: Abdomen is flat. Bowel sounds are normal.     Palpations: Abdomen is soft.     Hernia: There is no hernia in the left inguinal area or right inguinal area.  Genitourinary:    General: Normal vulva.     Exam position: Supine.     Pubic Area: No rash.      Vagina: Normal. No vaginal discharge or bleeding.     Cervix: Cervical bleeding present. No cervical motion tenderness, friability or erythema.     Uterus: Normal. Not enlarged and not tender.      Adnexa: Right adnexa normal and left adnexa normal.       Right: No mass or tenderness.         Left: No mass or tenderness.       Comments: Some scant bleeding noted from cervical os Wet prep and GC chlamydia swabs  obtained Musculoskeletal:     Cervical back: Normal range of motion.  Neurological:     Mental Status: She is alert.    ED Results / Procedures / Treatments   Labs (all labs ordered are listed, but only abnormal results are displayed) Labs Reviewed  CBC - Abnormal; Notable for the following components:      Result Value   MCV 75.0 (*)    MCH 25.4 (*)    All other components within normal limits  WET PREP, GENITAL  HIV ANTIBODY (ROUTINE TESTING W REFLEX)  RAPID HIV SCREEN (HIV 1/2 AB+AG)  I-STAT BETA HCG BLOOD, ED ([facility], [facility], [facility] ONLY)  GC/CHLAMYDIA PROBE AMP ([place]) NOT AT [facility]    EKG None  Radiology No results found.  Procedures Procedures    Medications Ordered in ED Medications  azithromycin (ZITHROMAX) tablet 1,000 mg (has no administration in time range)  cefTRIAXone (ROCEPHIN) injection 500 mg (has no administration in time range)  lidocaine (PF) (XYLOCAINE) 1 % injection 1 mL (has no administration in time range)    ED Course/ Medical Decision Making/ A&P Clinical Course as of 02/21/21 1101  Tue Feb 21, 2021  1058 CBC reviewed and interpreted and hemoglobin, white blood cell count, platelets are within normal limits with some decreased MCV consistent with iron deficiency anemia I-STAT (test is negative Wet prep significant for less than 10 white blood cells Patient treated for GC and chlamydia with Rocephin and Zithromax  [DR]    Clinical Course User Index [DR] Margarita Grizzle, MD                           Medical Decision Making 26 year old female with complaints of swelling to the left breast.  I do not appreciate any large mass or any fluctuance redness, warmth, or any signs of infection.  There may be some small area below the nipple on the left that needs further evaluation. Additionally patient is complaining of some abnormal vaginal bleeding.  She is checked here for pregnancy and hemoglobin. She has also had wet prep and GC chlamydia sent.  Will  tailor treatment down based on wet prep for the biotics. Plan referral to women's health for further evaluation of any breast mass Patient with stable hemoglobin.  However decreased MCV consistent with iron deficiency anemia we will add iron   Amount and/or Complexity of Data Reviewed Labs: ordered.  Decision-making details documented in ED Course.  Risk OTC drugs. Prescription drug management. Risk Details: Plan referral to outpatient women's health for follow-up of abnormal vaginal bleeding and breast mass.    All above discussed with patient including plan for follow-up and return precautions and she voices understanding. Patient appears stable does not appear to meet criteria for admission.  She was evaluated for this with blood count and pregnancy test.  These are normal and she does not appear to need any further intervention at this time       Final Clinical Impression(s) / ED Diagnoses Final diagnoses:  Vaginal bleeding  Breast swelling    Rx / DC Orders ED Discharge Orders     None         Margarita Grizzle, MD 02/21/21 1102

## 2021-02-22 ENCOUNTER — Telehealth: Payer: Medicaid Other | Admitting: Physician Assistant

## 2021-02-22 DIAGNOSIS — H66004 Acute suppurative otitis media without spontaneous rupture of ear drum, recurrent, right ear: Secondary | ICD-10-CM | POA: Diagnosis not present

## 2021-02-22 LAB — GC/CHLAMYDIA PROBE AMP (~~LOC~~) NOT AT ARMC
Chlamydia: NEGATIVE
Comment: NEGATIVE
Comment: NORMAL
Neisseria Gonorrhea: NEGATIVE

## 2021-02-22 MED ORDER — NEOMYCIN-POLYMYXIN-HC 3.5-10000-1 OT SOLN
3.0000 [drp] | Freq: Four times a day (QID) | OTIC | 0 refills | Status: DC
Start: 2021-02-22 — End: 2023-09-22

## 2021-02-22 MED ORDER — AMOXICILLIN 500 MG PO CAPS: 500.0000 mg | ORAL_CAPSULE | Freq: Two times a day (BID) | ORAL | 0 refills | Status: AC

## 2021-02-22 NOTE — Progress Notes (Signed)
Virtual Visit Consent   Mackenzie Santiago, you are scheduled for a virtual visit with a [facility] provider today.     Just as with appointments in the office, your consent must be obtained to participate.  Your consent will be active for this visit and any virtual visit you may have with one of our providers in the next 365 days.     If you have a MyChart account, a copy of this consent can be sent to you electronically.  All virtual visits are billed to your insurance company just like a traditional visit in the office.    As this is a virtual visit, video technology does not allow for your provider to perform a traditional examination.  This may limit your provider's ability to fully assess your condition.  If your provider identifies any concerns that need to be evaluated in person or the need to arrange testing (such as labs, EKG, etc.), we will make arrangements to do so.     Although advances in technology are sophisticated, we cannot ensure that it will always work on either your end or our end.  If the connection with a video visit is poor, the visit may have to be switched to a telephone visit.  With either a video or telephone visit, we are not always able to ensure that we have a secure connection.     I need to obtain your verbal consent now.   Are you willing to proceed with your visit today?    Mackenzie Santiago has provided verbal consent on 02/22/2021 for a virtual visit (video or telephone).   [name], PA-C   Date: 02/22/2021 3:43 PM   Virtual Visit via Video Note   IMargaretann Loveless, connected with  Mackenzie Santiago  ([id number], [birth year]) on 02/22/21 at  3:30 PM EST by a video-enabled telemedicine application and verified that I am speaking with the correct person using two identifiers.  Location: Patient: Virtual Visit Location Patient: Home Provider: Virtual Visit Location Provider: Home Office   I discussed the limitations of evaluation and  management by telemedicine and the availability of in person appointments. The patient expressed understanding and agreed to proceed.    History of Present Illness: Mackenzie Santiago is a 26 y.o. who identifies as a female who was assigned female at birth, and is being seen today for possible ear infection.  HPI: Otalgia  There is pain in the right ear. This is a new problem. The current episode started yesterday. The problem occurs constantly. The problem has been gradually worsening. There has been no fever. The pain is moderate. Associated symptoms include headaches (on the right side as well), hearing loss (muffled hearing in right; mild tinnitus), rhinorrhea and a sore throat (scratchy). Pertinent negatives include no ear discharge. Associated symptoms comments: Nasal congestion. She has tried nothing for the symptoms. The treatment provided no relief. 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 There is no history of a tympanostomy tube.     Problems:  Patient Active Problem List   Diagnosis Date Noted   Hyperthyroidism 06/26/2020   Obstetric labial laceration, delivered, current hospitalization 11/23/2015   SVD (spontaneous vaginal delivery) 11/22/2015   H/O anxiety state 11/22/2015   H/O sickle cell trait (FOB neg) 11/22/2015   H/O chlamydia infection (neg TOC this pregnancy) 11/22/2015   Group B streptococcal bacteriuria 11/22/2015   H/O: depression 11/21/2015   Alcohol abuse 03/23/2014   Smoker 03/23/2014  Thyroid dysfunction 05/07/2011   Tonsillar hypertrophy 05/07/2011   Abnormal weight loss 02/19/2011    Allergies:  Allergies  Allergen Reactions   Blueberry [Vaccinium Angustifolium] Hives        Medications:  Current Outpatient Medications:    amoxicillin (AMOXIL) 500 MG capsule, Take 1 capsule (500 mg total) by mouth 2 (two) times daily for 10 days., Disp: 20 capsule, Rfl: 0   neomycin-polymyxin-hydrocortisone (CORTISPORIN)  OTIC solution, Place 3 drops into the right ear 4 (four) times daily. X 5-7 days, Disp: 10 mL, Rfl: 0   CHLOROPHYLL PO, Take by mouth., Disp: , Rfl:    ketoconazole (NIZORAL) 2 % cream, Apply 1 application topically daily., Disp: 30 g, Rfl: 1   MACA ROOT PO, Take by mouth., Disp: , Rfl:    metroNIDAZOLE (METROGEL VAGINAL) 0.75 % vaginal gel, Place 1 Applicatorful vaginally at bedtime., Disp: 70 g, Rfl: 0   Multiple Vitamin (MULTIVITAMIN WITH MINERALS) TABS tablet, Take 1 tablet by mouth daily., Disp: , Rfl:    ondansetron (ZOFRAN) 4 MG tablet, Take 1 tablet (4 mg total) by mouth every 6 (six) hours., Disp: 12 tablet, Rfl: 0   OVER THE COUNTER MEDICATION, Take 1 capsule by mouth daily. Seamoss, Disp: , Rfl:   Observations/Objective: Patient is well-developed, well-nourished in no acute distress.  Resting comfortably at home.  Head is normocephalic, atraumatic.  No labored breathing.  Speech is clear and coherent with logical content.  Patient is alert and oriented at baseline.    Assessment and Plan: 1. Recurrent acute suppurative otitis media of right ear without spontaneous rupture of tympanic membrane - neomycin-polymyxin-hydrocortisone (CORTISPORIN) OTIC solution; Place 3 drops into the right ear 4 (four) times daily. X 5-7 days  Dispense: 10 mL; Refill: 0 - amoxicillin (AMOXIL) 500 MG capsule; Take 1 capsule (500 mg total) by mouth 2 (two) times daily for 10 days.  Dispense: 20 capsule; Refill: 0  - Suspect otitis media of right ear - Will treat with Cortisporin and Amoxicillin - May use flonase OTC as needed - Tylenol as needed - Push fluids - Rest as needed - Seek in person evaluation if not improving or worsening  Follow Up Instructions: I discussed the assessment and treatment plan with the patient. The patient was provided an opportunity to ask questions and all were answered. The patient agreed with the plan and demonstrated an understanding of the instructions.  A copy of  instructions were sent to the patient via MyChart unless otherwise noted below.    The patient was advised to call back or seek an in-person evaluation if the symptoms worsen or if the condition fails to improve as anticipated.  Time:  I spent 12 minutes with the patient via telehealth technology discussing the above problems/concerns.    [name], PA-C

## 2021-02-22 NOTE — Patient Instructions (Signed)
Mackenzie Santiago, thank you for joining Mackenzie Loveless, PA-C for today's virtual visit.  While this provider is not your primary care provider (PCP), if your PCP is located in our provider database this encounter information will be shared with them immediately following your visit.  Consent: (Patient) Mackenzie Santiago provided verbal consent for this virtual visit at the beginning of the encounter.  Current Medications:  Current Outpatient Medications:    amoxicillin (AMOXIL) 500 MG capsule, Take 1 capsule (500 mg total) by mouth 2 (two) times daily for 10 days., Disp: 20 capsule, Rfl: 0   neomycin-polymyxin-hydrocortisone (CORTISPORIN) OTIC solution, Place 3 drops into the right ear 4 (four) times daily. X 5-7 days, Disp: 10 mL, Rfl: 0   CHLOROPHYLL PO, Take by mouth., Disp: , Rfl:    ketoconazole (NIZORAL) 2 % cream, Apply 1 application topically daily., Disp: 30 g, Rfl: 1   MACA ROOT PO, Take by mouth., Disp: , Rfl:    metroNIDAZOLE (METROGEL VAGINAL) 0.75 % vaginal gel, Place 1 Applicatorful vaginally at bedtime., Disp: 70 g, Rfl: 0   Multiple Vitamin (MULTIVITAMIN WITH MINERALS) TABS tablet, Take 1 tablet by mouth daily., Disp: , Rfl:    ondansetron (ZOFRAN) 4 MG tablet, Take 1 tablet (4 mg total) by mouth every 6 (six) hours., Disp: 12 tablet, Rfl: 0   OVER THE COUNTER MEDICATION, Take 1 capsule by mouth daily. Seamoss, Disp: , Rfl:    Medications ordered in this encounter:  Meds ordered this encounter  Medications   neomycin-polymyxin-hydrocortisone (CORTISPORIN) OTIC solution    Sig: Place 3 drops into the right ear 4 (four) times daily. X 5-7 days    Dispense:  10 mL    Refill:  0    Order Specific Question:   Supervising Provider    Answer:   Mackenzie Santiago [[id number]]   amoxicillin (AMOXIL) 500 MG capsule    Sig: Take 1 capsule (500 mg total) by mouth 2 (two) times daily for 10 days.    Dispense:  20 capsule    Refill:  0    Order Specific Question:   Supervising  Provider    Answer:   Mackenzie Santiago [[id number]]     *If you need refills on other medications prior to your next appointment, please contact your pharmacy*  Follow-Up: Call back or seek an in-person evaluation if the symptoms worsen or if the condition fails to improve as anticipated.  Other Instructions Otitis Media, Adult Otitis media is a condition in which the middle ear is red and swollen (inflamed) and full of fluid. The middle ear is the part of the ear that contains bones for hearing as well as air that helps send sounds to the brain. The condition usually goes away on its own. What are the causes? This condition is caused by a blockage in the eustachian tube. This tube connects the middle ear to the back of the nose. It normally allows air into the middle ear. The blockage is caused by fluid or swelling. Problems that can cause blockage include: A cold or infection that affects the nose, mouth, or throat. Allergies. An irritant, such as tobacco smoke. Adenoids that have become large. The adenoids are soft tissue located in the back of the throat, behind the nose and the roof of the mouth. Growth or swelling in the upper part of the throat, just behind the nose (nasopharynx). Damage to the ear caused by a change in pressure. This is called barotrauma. What increases the risk? You  are more likely to develop this condition if you: Smoke or are exposed to tobacco smoke. Have an opening in the roof of your mouth (cleft palate). Have acid reflux. Have problems in your body's defense system (immune system). What are the signs or symptoms? Symptoms of this condition include: Ear pain. Fever. Problems with hearing. Being tired. Fluid leaking from the ear. Ringing in the ear. How is this treated? This condition can go away on its own within 3-5 days. But if the condition is caused by germs (bacteria) and does not go away on its own, or if it keeps coming back, your doctor may: Give you  antibiotic medicines. Give you medicines for pain. Follow these instructions at home: Take over-the-counter and prescription medicines only as told by your doctor. If you were prescribed an antibiotic medicine, take it as told by your doctor. Do not stop taking it even if you start to feel better. Keep all follow-up visits. Contact a doctor if: You have bleeding from your nose. There is a lump on your neck. You are not feeling better in 5 days. You feel worse instead of better. Get help right away if: You have pain that is not helped with medicine. You have swelling, redness, or pain around your ear. You get a stiff neck. You cannot move part of your face (paralysis). You notice that the bone behind your ear hurts when you touch it. You get a very bad headache. Summary Otitis media means that the middle ear is red, swollen, and full of fluid. This condition usually goes away on its own. If the problem does not go away, treatment may be needed. You may be given medicines to treat the infection or to treat your pain. If you were prescribed an antibiotic medicine, take it as told by your doctor. Do not stop taking it even if you start to feel better. Keep all follow-up visits. This information is not intended to replace advice given to you by your health care provider. Make sure you discuss any questions you have with your health care provider. Document Revised: 04/11/2020 Document Reviewed: 04/11/2020 Elsevier Patient Education  2022 [employer].    If you have been instructed to have an in-person evaluation today at a local Urgent Care facility, please use the link below. It will take you to a list of all of our available [facility], including address, phone number and hours of operation. Please do not delay care.  [facility]  If you or a family member do not have a primary care provider, use the link below to schedule a visit and establish care. When you  choose a [facility] primary care physician or advanced practice provider, you gain a long-term partner in health. Find a Primary Care Provider  Learn more about [facility] in-office and virtual care options: [facility] - Get Care Now

## 2021-10-27 ENCOUNTER — Ambulatory Visit
Admission: EM | Admit: 2021-10-27 | Discharge: 2021-10-27 | Disposition: A | Discharge status: Home / Self Care | Payer: Medicaid Other | Attending: Urgent Care | Admitting: Urgent Care

## 2021-10-27 DIAGNOSIS — B9689 Other specified bacterial agents as the cause of diseases classified elsewhere: Secondary | ICD-10-CM | POA: Diagnosis present

## 2021-10-27 DIAGNOSIS — N760 Acute vaginitis: Secondary | ICD-10-CM | POA: Diagnosis present

## 2021-10-27 DIAGNOSIS — B3731 Acute candidiasis of vulva and vagina: Secondary | ICD-10-CM

## 2021-10-27 DIAGNOSIS — Z3202 Encounter for pregnancy test, result negative: Secondary | ICD-10-CM

## 2021-10-27 LAB — POCT URINE PREGNANCY: Preg Test, Ur: NEGATIVE

## 2021-10-27 MED ORDER — FLUCONAZOLE 150 MG PO TABS: 150.0000 mg | ORAL_TABLET | ORAL | 0 refills | Status: DC

## 2021-10-27 MED ORDER — METRONIDAZOLE 500 MG PO TABS: 500.0000 mg | ORAL_TABLET | Freq: Two times a day (BID) | ORAL | 0 refills | Status: DC

## 2021-10-27 NOTE — ED Triage Notes (Signed)
Pt states she is having vaginal discharge on and off for the past few months. Also states she had unprotected sex.

## 2021-10-27 NOTE — ED Provider Notes (Signed)
[facility]  Note:  This document was prepared using Systems analyst and may include unintentional dictation errors.  MRN: [id number] DOB: [birth year]  Subjective:   Mackenzie Santiago is a 26 y.o. female presenting for several day history of acute onset recurrent BV.  Has difficulty with recurrent BV infections.  She is also had a history of trichomonas and chlamydia per patient.  Would like to be checked for STIs.  Has 1 female partner. Had sex with 1 female last month. She did have unprotected sex. Denies fever, n/v, abdominal pain, pelvic pain, rashes, dysuria, urinary frequency, hematuria.  She is a few days late on her cycle as well.  No contraception.   No current facility-administered medications for this encounter.  Current Outpatient Medications:    CHLOROPHYLL PO, Take by mouth., Disp: , Rfl:    ketoconazole (NIZORAL) 2 % cream, Apply 1 application topically daily., Disp: 30 g, Rfl: 1   MACA ROOT PO, Take by mouth., Disp: , Rfl:    metroNIDAZOLE (METROGEL VAGINAL) 0.75 % vaginal gel, Place 1 Applicatorful vaginally at bedtime., Disp: 70 g, Rfl: 0   Multiple Vitamin (MULTIVITAMIN WITH MINERALS) TABS tablet, Take 1 tablet by mouth daily., Disp: , Rfl:    neomycin-polymyxin-hydrocortisone (CORTISPORIN) OTIC solution, Place 3 drops into the right ear 4 (four) times daily. X 5-7 days, Disp: 10 mL, Rfl: 0   ondansetron (ZOFRAN) 4 MG tablet, Take 1 tablet (4 mg total) by mouth every 6 (six) hours., Disp: 12 tablet, Rfl: 0   OVER THE COUNTER MEDICATION, Take 1 capsule by mouth daily. Seamoss, Disp: , Rfl:    Allergies  Allergen Reactions   Blueberry [Vaccinium Angustifolium] Hives         Past Medical History:  Diagnosis Date   Heart murmur    Hyperthyroidism    Thyroid disease    Trichimoniasis      Past Surgical History:  Procedure Laterality Date   NO PAST SURGERIES      Family History  Problem Relation Age of [facility]   Sickle  cell anemia Father    Sickle cell anemia Maternal Grandfather    Thyroid disease Paternal Grandmother    Sickle cell anemia Paternal Grandfather    Hypertension Mother    Alcohol abuse Neg Hx    Arthritis Neg Hx    Asthma Neg Hx    Birth defects Neg Hx    Cancer Neg Hx    COPD Neg Hx    Depression Neg Hx    Diabetes Neg Hx    Drug abuse Neg Hx    Early death Neg Hx    Hearing loss Neg Hx    Heart disease Neg Hx    Hyperlipidemia Neg Hx    Kidney disease Neg Hx    Learning disabilities Neg Hx    Mental illness Neg Hx    Mental retardation Neg Hx    Miscarriages / Stillbirths Neg Hx    Stroke Neg Hx    Vision loss Neg Hx    Varicose Veins Neg Hx     Social History   Tobacco Use   Smoking status: Former    Packs/day: 0.25    Types: Cigarettes   Smokeless tobacco: Never  Vaping Use   Vaping Use: Every day  Substance Use Topics   Alcohol use: No    Comment: social   Drug use: No    ROS   Objective:   Vitals: BP 111/72 (BP  Location: Left Arm)   Pulse 82   Temp 98.5 F (36.9 C) (Oral)   Resp 16   LMP 09/20/2020 (Approximate)   SpO2 98%   Physical Exam Constitutional:      General: She is not in acute distress.    Appearance: Normal appearance. She is well-developed. She is not ill-appearing, toxic-appearing or diaphoretic.  HENT:     Head: Normocephalic and atraumatic.     Nose: Nose normal.     Mouth/Throat:     Mouth: Mucous membranes are moist.  Eyes:     General: No scleral icterus.       Right eye: No discharge.        Left eye: No discharge.     Extraocular Movements: Extraocular movements intact.     Conjunctiva/sclera: Conjunctivae normal.  Cardiovascular:     Rate and Rhythm: Normal rate.  Pulmonary:     Effort: Pulmonary effort is normal.  Abdominal:     General: Bowel sounds are normal. There is no distension.     Palpations: Abdomen is soft. There is no mass.     Tenderness: There is no abdominal tenderness. There is no right CVA  tenderness, left CVA tenderness, guarding or rebound.  Skin:    General: Skin is warm and dry.  Neurological:     General: No focal deficit present.     Mental Status: She is alert and oriented to person, place, and time.  Psychiatric:        Mood and Affect: Mood normal.        Behavior: Behavior normal.        Thought Content: Thought content normal.        Judgment: Judgment normal.    Results for orders placed or performed during the hospital encounter of 10/27/21 (from the past 24 hour(s))  POCT urine pregnancy     Status: None   Collection Time: 10/27/21 11:46 AM  Result Value Ref Range   Preg Test, Ur Negative Negative    Assessment and Plan :   PDMP not reviewed this encounter.  1. Bacterial vaginosis   2. Yeast vaginitis     We will treat empirically for recurrent bacterial vaginosis with Flagyl and oral fluconazole for yeast vaginitis.  Labs pending, will treat as appropriate otherwise based off of results. Counseled patient on potential for adverse effects with medications prescribed/recommended today, ER and return-to-clinic precautions discussed, patient verbalized understanding.    Wallis Bamberg, PA-C 10/27/21 1152

## 2021-10-30 LAB — CERVICOVAGINAL ANCILLARY ONLY
Bacterial Vaginitis (gardnerella): POSITIVE — AB
Candida Glabrata: NEGATIVE
Candida Vaginitis: NEGATIVE
Chlamydia: NEGATIVE
Comment: NEGATIVE
Comment: NEGATIVE
Comment: NEGATIVE
Comment: NEGATIVE
Comment: NEGATIVE
Comment: NORMAL
Neisseria Gonorrhea: NEGATIVE
Trichomonas: NEGATIVE

## 2021-11-28 ENCOUNTER — Telehealth: Payer: Medicaid Other | Admitting: Family Medicine

## 2021-11-28 DIAGNOSIS — B3731 Acute candidiasis of vulva and vagina: Secondary | ICD-10-CM

## 2021-11-28 DIAGNOSIS — B9689 Other specified bacterial agents as the cause of diseases classified elsewhere: Secondary | ICD-10-CM | POA: Diagnosis not present

## 2021-11-28 DIAGNOSIS — N760 Acute vaginitis: Secondary | ICD-10-CM | POA: Diagnosis not present

## 2021-11-28 MED ORDER — FLUCONAZOLE 150 MG PO TABS: 150.0000 mg | ORAL_TABLET | Freq: Once | ORAL | 0 refills | Status: AC

## 2021-11-28 MED ORDER — METRONIDAZOLE 0.75 % EX GEL: 1.0000 | Freq: Every day | CUTANEOUS | 0 refills | Status: AC

## 2021-11-28 NOTE — Progress Notes (Signed)
Virtual Visit Consent   Mackenzie Santiago, you are scheduled for a virtual visit with a [facility] provider today. Just as with appointments in the office, your consent must be obtained to participate. Your consent will be active for this visit and any virtual visit you may have with one of our providers in the next 365 days. If you have a MyChart account, a copy of this consent can be sent to you electronically.  As this is a virtual visit, video technology does not allow for your provider to perform a traditional examination. This may limit your provider's ability to fully assess your condition. If your provider identifies any concerns that need to be evaluated in person or the need to arrange testing (such as labs, EKG, etc.), we will make arrangements to do so. Although advances in technology are sophisticated, we cannot ensure that it will always work on either your end or our end. If the connection with a video visit is poor, the visit may have to be switched to a telephone visit. With either a video or telephone visit, we are not always able to ensure that we have a secure connection.  By engaging in this virtual visit, you consent to the provision of healthcare and authorize for your insurance to be billed (if applicable) for the services provided during this visit. Depending on your insurance coverage, you may receive a charge related to this service.  I need to obtain your verbal consent now. Are you willing to proceed with your visit today? Mackenzie Santiago has provided verbal consent on 11/28/2021 for a virtual visit (video or telephone). Mackenzie Finner, NP  Date: 11/28/2021 3:33 PM  Virtual Visit via Video Note   I, Mackenzie Santiago, connected with  Mackenzie Santiago  ([id number], Dec 04, 1995) on 11/28/21 at  3:30 PM EST by a video-enabled telemedicine application and verified that I am speaking with the correct person using two identifiers.  Location: Patient: Virtual Visit Location  Patient: Home Provider: Virtual Visit Location Provider: Home Office   I discussed the limitations of evaluation and management by telemedicine and the availability of in person appointments. The patient expressed understanding and agreed to proceed.    History of Present Illness: Mackenzie Santiago is a 26 y.o. who identifies as a female who was assigned female at birth, and is being seen today for BV and yeast. Reports that she did not finish all the Flagyl due to it making her stomach hurt. She reports having and on going smell, discharge and itching. One partner- recent STD check neg. Would like cream to try to help with BV.  Denies fevers, chills, changes in bladder or bowel habits, flank, back, pelvic, or abdominal pain. Problems:  Patient Active Problem List   Diagnosis Date Noted   Hyperthyroidism 06/26/2020   Obstetric labial laceration, delivered, current hospitalization 11/23/2015   SVD (spontaneous vaginal delivery) 11/22/2015   H/O anxiety state 11/22/2015   H/O sickle cell trait (FOB neg) 11/22/2015   H/O chlamydia infection (neg TOC this pregnancy) 11/22/2015   Group B streptococcal bacteriuria 11/22/2015   H/O: depression 11/21/2015   Alcohol abuse 03/23/2014   Smoker 03/23/2014   Thyroid dysfunction 05/07/2011   Tonsillar hypertrophy 05/07/2011   Abnormal weight loss 02/19/2011    Allergies:  Allergies  Allergen Reactions   Blueberry [Vaccinium Angustifolium] Hives        Medications:  Current Outpatient Medications:    fluconazole (DIFLUCAN) 150 MG tablet, Take 1 tablet (150 mg total) by mouth  once for 1 dose., Disp: 1 tablet, Rfl: 0   metroNIDAZOLE (METROGEL) 0.75 % gel, Apply 1 Application topically daily for 5 days., Disp: 45 g, Rfl: 0   CHLOROPHYLL PO, Take by mouth., Disp: , Rfl:    ketoconazole (NIZORAL) 2 % cream, Apply 1 application topically daily., Disp: 30 g, Rfl: 1   MACA ROOT PO, Take by mouth., Disp: , Rfl:    metroNIDAZOLE (FLAGYL) 500 MG tablet,  Take 1 tablet (500 mg total) by mouth 2 (two) times daily with a meal. DO NOT CONSUME ALCOHOL WHILE TAKING THIS MEDICATION., Disp: 14 tablet, Rfl: 0   Multiple Vitamin (MULTIVITAMIN WITH MINERALS) TABS tablet, Take 1 tablet by mouth daily., Disp: , Rfl:    neomycin-polymyxin-hydrocortisone (CORTISPORIN) OTIC solution, Place 3 drops into the right ear 4 (four) times daily. X 5-7 days, Disp: 10 mL, Rfl: 0   ondansetron (ZOFRAN) 4 MG tablet, Take 1 tablet (4 mg total) by mouth every 6 (six) hours., Disp: 12 tablet, Rfl: 0   OVER THE COUNTER MEDICATION, Take 1 capsule by mouth daily. Seamoss, Disp: , Rfl:   Observations/Objective: Patient is well-developed, well-nourished in no acute distress.  Resting comfortably  at home.  Head is normocephalic, atraumatic.  No labored breathing.  Speech is clear and coherent with logical content.  Patient is alert and oriented at baseline.    Assessment and Plan: 1. Yeast vaginitis - fluconazole (DIFLUCAN) 150 MG tablet; Take 1 tablet (150 mg total) by mouth once for 1 dose.  Dispense: 1 tablet; Refill: 0  2. Bacterial vaginosis - metroNIDAZOLE (METROGEL) 0.75 % gel; Apply 1 Application topically daily for 5 days.  Dispense: 45 g; Refill: 0  -reviewed education on medication -info on AVS -in person strict precautions reviewed as well   Reviewed side effects, risks and benefits of medication.    Patient acknowledged agreement and understanding of the plan.   Past Medical, Surgical, Social History, Allergies, and Medications have been Reviewed.    Follow Up Instructions: I discussed the assessment and treatment plan with the patient. The patient was provided an opportunity to ask questions and all were answered. The patient agreed with the plan and demonstrated an understanding of the instructions.  A copy of instructions were sent to the patient via MyChart unless otherwise noted below.     The patient was advised to call back or seek an  in-person evaluation if the symptoms worsen or if the condition fails to improve as anticipated.  Time:  I spent 10 minutes with the patient via telehealth technology discussing the above problems/concerns.    Mackenzie Finner, NP

## 2021-11-28 NOTE — Patient Instructions (Signed)
[employer], thank you for joining Freddy Finner, NP for today's virtual visit.  While this provider is not your primary care provider (PCP), if your PCP is located in our provider database this encounter information will be shared with them immediately following your visit.   A [facility] MyChart account gives you access to today's visit and all your visits, tests, and labs performed at [facility] " click here if you don't have a  MyChart account or go to [email or web address]  Consent: (Patient) Mackenzie Santiago provided verbal consent for this virtual visit at the beginning of the encounter.  Current Medications:  Current Outpatient Medications:    fluconazole (DIFLUCAN) 150 MG tablet, Take 1 tablet (150 mg total) by mouth once for 1 dose., Disp: 1 tablet, Rfl: 0   metroNIDAZOLE (METROGEL) 0.75 % gel, Apply 1 Application topically daily for 5 days., Disp: 45 g, Rfl: 0   CHLOROPHYLL PO, Take by mouth., Disp: , Rfl:    ketoconazole (NIZORAL) 2 % cream, Apply 1 application topically daily., Disp: 30 g, Rfl: 1   MACA ROOT PO, Take by mouth., Disp: , Rfl:    metroNIDAZOLE (FLAGYL) 500 MG tablet, Take 1 tablet (500 mg total) by mouth 2 (two) times daily with a meal. DO NOT CONSUME ALCOHOL WHILE TAKING THIS MEDICATION., Disp: 14 tablet, Rfl: 0   Multiple Vitamin (MULTIVITAMIN WITH MINERALS) TABS tablet, Take 1 tablet by mouth daily., Disp: , Rfl:    neomycin-polymyxin-hydrocortisone (CORTISPORIN) OTIC solution, Place 3 drops into the right ear 4 (four) times daily. X 5-7 days, Disp: 10 mL, Rfl: 0   ondansetron (ZOFRAN) 4 MG tablet, Take 1 tablet (4 mg total) by mouth every 6 (six) hours., Disp: 12 tablet, Rfl: 0   OVER THE COUNTER MEDICATION, Take 1 capsule by mouth daily. Seamoss, Disp: , Rfl:    Medications ordered in this encounter:  Meds ordered this encounter  Medications   fluconazole (DIFLUCAN) 150 MG tablet    Sig: Take 1 tablet (150 mg total) by  mouth once for 1 dose.    Dispense:  1 tablet    Refill:  0    Order Specific Question:   Supervising Provider    Answer:   Merrilee Jansky [[id number]]   metroNIDAZOLE (METROGEL) 0.75 % gel    Sig: Apply 1 Application topically daily for 5 days.    Dispense:  45 g    Refill:  0    Order Specific Question:   Supervising Provider    Answer:   Merrilee Jansky [id number]     *If you need refills on other medications prior to your next appointment, please contact your pharmacy*  Follow-Up: Call back or seek an in-person evaluation if the symptoms worsen or if the condition fails to improve as anticipated.   Virtual Care [phone]  Other Instructions    Bacterial Vaginosis  Bacterial vaginosis is an infection of the vagina. It happens when too many normal germs (healthy bacteria) grow in the vagina. This infection can make it easier to get other infections from sex (STIs). It is very important for pregnant women to get treated. This infection can cause babies to be born early or at a low birth weight. What are the causes? This infection is caused by an increase in certain germs that grow in the vagina. You cannot get this infection from toilet seats, bedsheets, swimming pools, or things that touch your vagina. What increases the risk? Having sex with a  new person or more than one person. Having sex without protection. Douching. Having an intrauterine device (IUD). Smoking. Using drugs or drinking alcohol. These can lead you to do things that are risky. Taking certain antibiotic medicines. Being pregnant. What are the signs or symptoms? Some women have no symptoms. Symptoms may include: A discharge from your vagina. It may be gray or white. It can be watery or foamy. A fishy smell. This can happen after sex or during your menstrual period. Itching in and around your vagina. A feeling of burning or pain when you pee (urinate). How is this treated? This  infection is treated with antibiotic medicines. These may be given to you as: A pill. A cream for your vagina. A medicine that you put into your vagina (suppository). If the infection comes back after treatment, you may need more antibiotics. Follow these instructions at home: Medicines Take over-the-counter and prescription medicines as told by your doctor. Take or use your antibiotic medicine as told by your doctor. Do not stop taking or using it, even if you start to feel better. General instructions If the person you have sex with is a woman, tell her that you have this infection. She will need to follow up with her doctor. If you have a female partner, he does not need to be treated. Do not have sex until you finish treatment. Drink enough fluid to keep your pee pale yellow. Keep your vagina and butt clean. Wash the area with warm water each day. Wipe from front to back after you use the toilet. If you are breastfeeding a baby, ask your doctor if you should keep doing so during treatment. Keep all follow-up visits. How is this prevented? Self-care Do not douche. Use only warm water to wash around your vagina. Wear underwear that is cotton or lined with cotton. Do not wear tight pants and pantyhose, especially in the summer. Safe sex Use protection when you have sex. This includes: Use condoms. Use dental dams. This is a thin layer that protects the mouth during oral sex. Limit how many people you have sex with. To prevent this infection, it is best to have sex with just one person. Get tested for STIs. The person you have sex with should also get tested. Drugs and alcohol Do not smoke or use any products that contain nicotine or tobacco. If you need help quitting, ask your doctor. Do not use drugs. Do not drink alcohol if: Your doctor tells you not to drink. You are pregnant, may be pregnant, or are planning to become pregnant. If you drink alcohol: Limit how much you have to  0-1 drink a day. Know how much alcohol is in your drink. In the [place] one drink equals one 12 oz bottle of beer (355 mL), one 5 oz glass of wine (148 mL), or one 1 oz glass of hard liquor (44 mL). Where to find more information Centers for Disease Control and Prevention: FootballExhibition.com.br American Sexual Health Association: www.ashastd.org Office on [employer] Health: http://hoffman.com/ Contact a doctor if: Your symptoms do not get better, even after you are treated. You have more discharge or pain when you pee. You have a fever or chills. You have pain in your belly (abdomen) or in the area between your hips. You have pain with sex. You bleed from your vagina between menstrual periods. Summary This infection can happen when too many germs (bacteria) grow in the vagina. This infection can make it easier to get infections from sex (  STIs). Treating this can lower that chance. Get treated if you are pregnant. This infection can cause babies to be born early. Do not stop taking or using your antibiotic medicine, even if you start to feel better. This information is not intended to replace advice given to you by your health care provider. Make sure you discuss any questions you have with your health care provider. Document Revised: 07/02/2019 Document Reviewed: 07/02/2019 Elsevier Patient Education  2023 Elsevier Inc.   If you have been instructed to have an in-person evaluation today at a local Urgent Care facility, please use the link below. It will take you to a list of all of our available [facility], including address, phone number and hours of operation. Please do not delay care.  [facility]  If you or a family member do not have a primary care provider, use the link below to schedule a visit and establish care. When you choose a [facility] primary care physician or advanced practice provider, you gain a long-term partner in health. Find a Primary Care  Provider  Learn more about [facility]'s in-office and virtual care options:  - Get Care Now

## 2021-11-29 ENCOUNTER — Telehealth: Payer: Medicaid Other

## 2021-12-10 ENCOUNTER — Ambulatory Visit (HOSPITAL_COMMUNITY)
Admission: EM | Admit: 2021-12-10 | Discharge: 2021-12-10 | Disposition: A | Discharge status: Home / Self Care | Payer: Medicaid Other | Attending: Physician Assistant | Admitting: Physician Assistant

## 2021-12-10 ENCOUNTER — Encounter (HOSPITAL_COMMUNITY): Payer: Self-pay

## 2021-12-10 DIAGNOSIS — R21 Rash and other nonspecific skin eruption: Secondary | ICD-10-CM

## 2021-12-10 MED ORDER — CLOTRIMAZOLE 1 % EX CREA: 1.0000 | TOPICAL_CREAM | Freq: Two times a day (BID) | CUTANEOUS | 0 refills | Status: DC

## 2021-12-10 NOTE — ED Triage Notes (Signed)
Pt states her left nipple is itching and looks like a bruise has form. No trauma to the nipple. Pt does have a nipple ring.

## 2021-12-10 NOTE — ED Provider Notes (Signed)
[facility]    CSN: [id number] Arrival date & time: 12/10/21  1728      History   Chief Complaint Chief Complaint  Patient presents with   Breast Problem    HPI Mackenzie Santiago is a 26 y.o. female.   Pt complains of itching around her left nipple with some discoloration.  She does have a nipple piercing, but reports this is not new.  She denies injury or trauma, denies nipple discharge or pain.  She is not breastfeeding. Nothing seems to make the sx better or worse.     Past Medical History:  Diagnosis Date   Heart murmur    Hyperthyroidism    Thyroid disease    Trichimoniasis     Patient Active Problem List   Diagnosis Date Noted   Hyperthyroidism 06/26/2020   Obstetric labial laceration, delivered, current hospitalization 11/23/2015   SVD (spontaneous vaginal delivery) 11/22/2015   H/O anxiety state 11/22/2015   H/O sickle cell trait (FOB neg) 11/22/2015   H/O chlamydia infection (neg TOC this pregnancy) 11/22/2015   Group B streptococcal bacteriuria 11/22/2015   H/O: depression 11/21/2015   Alcohol abuse 03/23/2014   Smoker 03/23/2014   Thyroid dysfunction 05/07/2011   Tonsillar hypertrophy 05/07/2011   Abnormal weight loss 02/19/2011    Past Surgical History:  Procedure Laterality Date   NO PAST SURGERIES      OB History     Gravida  3   Para  1   Term  1   Preterm      AB      Living  1      SAB      IAB      Ectopic      Multiple  0   Live Births  1            Home Medications    Prior to Admission medications   Medication Sig Start Date End Date Taking? Authorizing Provider  clotrimazole (LOTRIMIN) 1 % cream Apply 1 Application topically 2 (two) times daily. 12/10/21  Yes Ward, Tylene Fantasia, PA-C  CHLOROPHYLL PO Take by mouth.    [provider]  ketoconazole (NIZORAL) 2 % cream Apply 1 application topically daily. 12/30/20   Junie Spencer, FNP  MACA ROOT PO Take by mouth.    [provider]  metroNIDAZOLE (FLAGYL) 500 MG tablet Take 1 tablet (500 mg total) by mouth 2 (two) times daily with a meal. DO NOT CONSUME ALCOHOL WHILE TAKING THIS MEDICATION. 10/27/21   Wallis Bamberg, PA-C  Multiple Vitamin (MULTIVITAMIN WITH MINERALS) TABS tablet Take 1 tablet by mouth daily.    [provider]  neomycin-polymyxin-hydrocortisone (CORTISPORIN) OTIC solution Place 3 drops into the right ear 4 (four) times daily. X 5-7 days 02/22/21   [name], PA-C  ondansetron (ZOFRAN) 4 MG tablet Take 1 tablet (4 mg total) by mouth every 6 (six) hours. 12/17/20   Prosperi, Christian H, PA-C  OVER THE COUNTER MEDICATION Take 1 capsule by mouth daily. Seamoss    [provider]    Family History Family History  Problem Relation Age of Onset   Sickle cell anemia Father    Sickle cell anemia Maternal Grandfather    Thyroid disease Paternal Grandmother    Sickle cell anemia Paternal Grandfather    Hypertension Mother    Alcohol abuse Neg Hx    Arthritis Neg Hx    Asthma Neg Hx    Birth defects Neg Hx  Cancer Neg Hx    COPD Neg Hx    Depression Neg Hx    Diabetes Neg Hx    Drug abuse Neg Hx    Early death Neg Hx    Hearing loss Neg Hx    Heart disease Neg Hx    Hyperlipidemia Neg Hx    Kidney disease Neg Hx    Learning disabilities Neg Hx    Mental illness Neg Hx    Mental retardation Neg Hx    Miscarriages / Stillbirths Neg Hx    Stroke Neg Hx    Vision loss Neg Hx    Varicose Veins Neg Hx     Social History Social History   Tobacco Use   Smoking status: Former    Packs/day: 0.25    Types: Cigarettes   Smokeless tobacco: Never  Vaping Use   Vaping Use: Every day  Substance Use Topics   Alcohol use: No    Comment: social   Drug use: No     Allergies   Blueberry [vaccinium angustifolium]   Review of Systems Review of Systems  Constitutional:  Negative for chills and fever.  HENT:  Negative for ear pain and sore throat.   Eyes:   Negative for pain and visual disturbance.  Respiratory:  Negative for cough and shortness of breath.   Cardiovascular:  Negative for chest pain and palpitations.  Gastrointestinal:  Negative for abdominal pain and vomiting.  Genitourinary:  Negative for dysuria and hematuria.  Musculoskeletal:  Negative for arthralgias and back pain.  Skin:  Positive for rash. Negative for color change.  Neurological:  Negative for seizures and syncope.  All other systems reviewed and are negative.    Physical Exam Triage Vital Signs ED Triage Vitals [12/10/21 1813]  Enc Vitals Group     BP 117/87     Pulse Rate 76     Resp 16     Temp 98.7 F (37.1 C)     Temp Source Oral     SpO2 99 %     Weight      Height      Head Circumference      Peak Flow      Pain Score      Pain Loc      Pain Edu?      Excl. in GC?    No data found.  Updated Vital Signs BP 117/87 (BP Location: Left Arm)   Pulse 76   Temp 98.7 F (37.1 C) (Oral)   Resp 16   LMP 03/13/2020   SpO2 99%   Visual Acuity Right Eye Distance:   Left Eye Distance:   Bilateral Distance:    Right Eye Near:   Left Eye Near:    Bilateral Near:     Physical Exam Vitals and nursing note reviewed.  Constitutional:      General: She is not in acute distress.    Appearance: She is well-developed.  HENT:     Head: Normocephalic and atraumatic.  Eyes:     Conjunctiva/sclera: Conjunctivae normal.  Cardiovascular:     Rate and Rhythm: Normal rate and regular rhythm.     Heart sounds: No murmur heard. Pulmonary:     Effort: Pulmonary effort is normal. No respiratory distress.     Breath sounds: Normal breath sounds.  Chest:    Abdominal:     Palpations: Abdomen is soft.     Tenderness: There is no abdominal tenderness.  Musculoskeletal:  General: No swelling.     Cervical back: Neck supple.  Skin:    General: Skin is warm and dry.     Capillary Refill: Capillary refill takes less than 2 seconds.  Neurological:      Mental Status: She is alert.  Psychiatric:        Mood and Affect: Mood normal.      UC Treatments / Results  Labs (all labs ordered are listed, but only abnormal results are displayed) Labs Reviewed - No data to display  EKG   Radiology No results found.  Procedures Procedures (including critical care time)  Medications Ordered in UC Medications - No data to display  Initial Impression / Assessment and Plan / UC Course  I have reviewed the triage vital signs and the nursing notes.  Pertinent labs & imaging results that were available during my care of the patient were reviewed by me and considered in my medical decision making (see chart for details).     Rash around left nipple, possible tinea.  Will prescribe lotrimin.  Return precautions discussed.  Final Clinical Impressions(s) / UC Diagnoses   Final diagnoses:  Rash and nonspecific skin eruption     Discharge Instructions      Apply cream to affected area If no improvement return for evaluation  Recommend establishing care with [facility]    ED Prescriptions     Medication Sig Dispense Auth. Provider   clotrimazole (LOTRIMIN) 1 % cream Apply 1 Application topically 2 (two) times daily. 30 g Ward, Tylene Fantasia, PA-C      PDMP not reviewed this encounter.   Ward, Tylene Fantasia, PA-C 12/26/21 1252

## 2021-12-10 NOTE — Discharge Instructions (Signed)
Apply cream to affected area If no improvement return for evaluation  Recommend establishing care with [facility]

## 2022-01-01 ENCOUNTER — Telehealth: Payer: Medicaid Other | Admitting: Family Medicine

## 2022-01-01 DIAGNOSIS — Z91199 Patient's noncompliance with other medical treatment and regimen due to unspecified reason: Secondary | ICD-10-CM

## 2022-01-01 NOTE — Progress Notes (Signed)
The patient no-showed for appointment despite this provider sending direct link, reaching out via phone with no response and waiting for at least 10 minutes from appointment time for patient to join. They will be marked as a NS for this appointment/time.   Mackenzie Santiago M [name], NP    

## 2022-01-25 ENCOUNTER — Telehealth: Payer: Medicaid Other | Admitting: Family Medicine

## 2022-01-25 DIAGNOSIS — B3731 Acute candidiasis of vulva and vagina: Secondary | ICD-10-CM

## 2022-01-25 MED ORDER — FLUCONAZOLE 150 MG PO TABS: 150.0000 mg | ORAL_TABLET | ORAL | 0 refills | Status: DC

## 2022-01-25 NOTE — Patient Instructions (Signed)
[employer], thank you for joining [name], NP for today's virtual visit.  While this provider is not your primary care provider (PCP), if your PCP is located in our provider database this encounter information will be shared with them immediately following your visit.   [facility] account gives you access to today's visit and all your visits, tests, and labs performed at [facility] " click here if you don't have a [facility] account or go to [email or web address]  Consent: (Patient) Christalynn Martin-King provided verbal consent for this virtual visit at the beginning of the encounter.  Current Medications:  Current Outpatient Medications:    fluconazole (DIFLUCAN) 150 MG tablet, Take 1 tablet (150 mg total) by mouth as directed. Repeat in 3 days, Disp: 2 tablet, Rfl: 0   CHLOROPHYLL PO, Take by mouth., Disp: , Rfl:    clotrimazole (LOTRIMIN) 1 % cream, Apply 1 Application topically 2 (two) times daily., Disp: 30 g, Rfl: 0   ketoconazole (NIZORAL) 2 % cream, Apply 1 application topically daily., Disp: 30 g, Rfl: 1   MACA ROOT PO, Take by mouth., Disp: , Rfl:    metroNIDAZOLE (FLAGYL) 500 MG tablet, Take 1 tablet (500 mg total) by mouth 2 (two) times daily with a meal. DO NOT CONSUME ALCOHOL WHILE TAKING THIS MEDICATION., Disp: 14 tablet, Rfl: 0   Multiple Vitamin (MULTIVITAMIN WITH MINERALS) TABS tablet, Take 1 tablet by mouth daily., Disp: , Rfl:    neomycin-polymyxin-hydrocortisone (CORTISPORIN) OTIC solution, Place 3 drops into the right ear 4 (four) times daily. X 5-7 days, Disp: 10 mL, Rfl: 0   ondansetron (ZOFRAN) 4 MG tablet, Take 1 tablet (4 mg total) by mouth every 6 (six) hours., Disp: 12 tablet, Rfl: 0   OVER THE COUNTER MEDICATION, Take 1 capsule by mouth daily. Seamoss, Disp: , Rfl:    Medications ordered in this encounter:  Meds ordered this encounter  Medications   fluconazole (DIFLUCAN) 150 MG tablet    Sig: Take 1 tablet (150  mg total) by mouth as directed. Repeat in 3 days    Dispense:  2 tablet    Refill:  0    Order Specific Question:   Supervising Provider    Answer:   Chase Picket [[id number]]     *If you need refills on other medications prior to your next appointment, please contact your pharmacy*  Follow-Up: Call back or seek an in-person evaluation if the symptoms worsen or if the condition fails to improve as anticipated.  Angelina [phone]  Other Instructions Vaginal Yeast Infection, Adult  Vaginal yeast infection is a condition that causes vaginal discharge as well as soreness, swelling, and redness (inflammation) of the vagina. This is a common condition. Some women get this infection frequently. What are the causes? This condition is caused by a change in the normal balance of the yeast (Candida) and normal bacteria that live in the vagina. This change causes an overgrowth of yeast, which causes the inflammation. What increases the risk? The condition is more likely to develop in women who: Take antibiotic medicines. Have diabetes. Take birth control pills. Are pregnant. Douche often. Have a weak body defense system (immune system). Have been taking steroid medicines for a long time. Frequently wear tight clothing. What are the signs or symptoms? Symptoms of this condition include: White, thick, creamy vaginal discharge. Swelling, itching, redness, and irritation of the vagina. The lips of the vagina (labia) may be affected as  well. Pain or a burning feeling while urinating. Pain during sex. How is this diagnosed? This condition is diagnosed based on: Your medical history. A physical exam. A pelvic exam. Your health care provider will examine a sample of your vaginal discharge under a microscope. Your health care provider may send this sample for testing to confirm the diagnosis. How is this treated? This condition is treated with medicine. Medicines may be  over-the-counter or prescription. You may be told to use one or more of the following: Medicine that is taken by mouth (orally). Medicine that is applied as a cream (topically). Medicine that is inserted directly into the vagina (suppository). Follow these instructions at home: Take or apply over-the-counter and prescription medicines only as told by your health care provider. Do not use tampons until your health care provider approves. Do not have sex until your infection has cleared. Sex can prolong or worsen your symptoms of infection. Ask your health care provider when it is safe to resume sexual activity. Keep all follow-up visits. This is important. How is this prevented?  Do not wear tight clothes, such as pantyhose or tight pants. Wear breathable cotton underwear. Do not use douches, perfumed soap, creams, or powders. Wipe from front to back after using the toilet. If you have diabetes, keep your blood sugar levels under control. Ask your health care provider for other ways to prevent yeast infections. Contact a health care provider if: You have a fever. Your symptoms go away and then return. Your symptoms do not get better with treatment. Your symptoms get worse. You have new symptoms. You develop blisters in or around your vagina. You have blood coming from your vagina and it is not your menstrual period. You develop pain in your abdomen. Summary Vaginal yeast infection is a condition that causes discharge as well as soreness, swelling, and redness (inflammation) of the vagina. This condition is treated with medicine. Medicines may be over-the-counter or prescription. Take or apply over-the-counter and prescription medicines only as told by your health care provider. Do not douche. Resume sexual activity or use of tampons as instructed by your health care provider. Contact a health care provider if your symptoms do not get better with treatment or your symptoms go away and  then return. This information is not intended to replace advice given to you by your health care provider. Make sure you discuss any questions you have with your health care provider. Document Revised: 03/21/2020 Document Reviewed: 03/21/2020 Elsevier Patient Education  [place].   Bacterial Vaginosis  Bacterial vaginosis is an infection of the vagina. It happens when too many normal germs (healthy bacteria) grow in the vagina. This infection can make it easier to get other infections from sex (STIs). It is very important for pregnant women to get treated. This infection can cause babies to be born early or at a low birth weight. What are the causes? This infection is caused by an increase in certain germs that grow in the vagina. You cannot get this infection from toilet seats, bedsheets, swimming pools, or things that touch your vagina. What increases the risk? Having sex with a new person or more than one person. Having sex without protection. Douching. Having an intrauterine device (IUD). Smoking. Using drugs or drinking alcohol. These can lead you to do things that are risky. Taking certain antibiotic medicines. Being pregnant. What are the signs or symptoms? Some women have no symptoms. Symptoms may include: A discharge from your vagina. It may  be gray or white. It can be watery or foamy. A fishy smell. This can happen after sex or during your menstrual period. Itching in and around your vagina. A feeling of burning or pain when you pee (urinate). How is this treated? This infection is treated with antibiotic medicines. These may be given to you as: A pill. A cream for your vagina. A medicine that you put into your vagina (suppository). If the infection comes back after treatment, you may need more antibiotics. Follow these instructions at home: Medicines Take over-the-counter and prescription medicines as told by your doctor. Take or use your antibiotic medicine  as told by your doctor. Do not stop taking or using it, even if you start to feel better. General instructions If the person you have sex with is a woman, tell her that you have this infection. She will need to follow up with her doctor. If you have a female partner, he does not need to be treated. Do not have sex until you finish treatment. Drink enough fluid to keep your pee pale yellow. Keep your vagina and butt clean. Wash the area with warm water each day. Wipe from front to back after you use the toilet. If you are breastfeeding a baby, ask your doctor if you should keep doing so during treatment. Keep all follow-up visits. How is this prevented? Self-care Do not douche. Use only warm water to wash around your vagina. Wear underwear that is cotton or lined with cotton. Do not wear tight pants and pantyhose, especially in the summer. Safe sex Use protection when you have sex. This includes: Use condoms. Use dental dams. This is a thin layer that protects the mouth during oral sex. Limit how many people you have sex with. To prevent this infection, it is best to have sex with just one person. Get tested for STIs. The person you have sex with should also get tested. Drugs and alcohol Do not smoke or use any products that contain nicotine or tobacco. If you need help quitting, ask your doctor. Do not use drugs. Do not drink alcohol if: Your doctor tells you not to drink. You are pregnant, may be pregnant, or are planning to become pregnant. If you drink alcohol: Limit how much you have to 0-1 drink a day. Know how much alcohol is in your drink. In the [place] one drink equals one 12 oz bottle of beer (355 mL), one 5 oz glass of wine (148 mL), or one 1 oz glass of hard liquor (44 mL). Where to find more information Centers for Disease Control and Prevention: FootballExhibition.com.br American Sexual Health Association: www.ashastd.org Office on [employer] Health: http://hoffman.com/ Contact a doctor  if: Your symptoms do not get better, even after you are treated. You have more discharge or pain when you pee. You have a fever or chills. You have pain in your belly (abdomen) or in the area between your hips. You have pain with sex. You bleed from your vagina between menstrual periods. Summary This infection can happen when too many germs (bacteria) grow in the vagina. This infection can make it easier to get infections from sex (STIs). Treating this can lower that chance. Get treated if you are pregnant. This infection can cause babies to be born early. Do not stop taking or using your antibiotic medicine, even if you start to feel better. This information is not intended to replace advice given to you by your health care provider. Make sure you discuss any questions  you have with your health care provider. Document Revised: 07/02/2019 Document Reviewed: 07/02/2019 Elsevier Patient Education  2023 Elsevier Inc.   If you have been instructed to have an in-person evaluation today at a local Urgent Care facility, please use the link below. It will take you to a list of all of our available [facility], including address, phone number and hours of operation. Please do not delay care.  [facility]  If you or a family member do not have a primary care provider, use the link below to schedule a visit and establish care. When you choose a [facility] primary care physician or advanced practice provider, you gain a long-term partner in health. Find a Primary Care Provider  Learn more about [facility]'s in-office and virtual care options: [facility] - Get Care Now

## 2022-01-25 NOTE — Progress Notes (Signed)
Virtual Visit Consent   Mackenzie Santiago, you are scheduled for a virtual visit with a [facility] provider today. Just as with appointments in the office, your consent must be obtained to participate. Your consent will be active for this visit and any virtual visit you may have with one of our providers in the next 365 days. If you have a MyChart account, a copy of this consent can be sent to you electronically.  As this is a virtual visit, video technology does not allow for your provider to perform a traditional examination. This may limit your provider's ability to fully assess your condition. If your provider identifies any concerns that need to be evaluated in person or the need to arrange testing (such as labs, EKG, etc.), we will make arrangements to do so. Although advances in technology are sophisticated, we cannot ensure that it will always work on either your end or our end. If the connection with a video visit is poor, the visit may have to be switched to a telephone visit. With either a video or telephone visit, we are not always able to ensure that we have a secure connection.  By engaging in this virtual visit, you consent to the provision of healthcare and authorize for your insurance to be billed (if applicable) for the services provided during this visit. Depending on your insurance coverage, you may receive a charge related to this service.  I need to obtain your verbal consent now. Are you willing to proceed with your visit today? Mackenzie Santiago has provided verbal consent on 01/25/2022 for a virtual visit (video or telephone). Mackenzie Mayo, NP  Date: 01/25/2022 3:10 PM  Virtual Visit via Video Note   I, Mackenzie Santiago, connected with  Mackenzie Santiago  ([id number], 08-01-1995) on 01/25/22 at  3:15 PM EST by a video-enabled telemedicine application and verified that I am speaking with the correct person using two identifiers.  Location: Patient: Virtual Visit Location  Patient: Home Provider: Virtual Visit Location Provider: Home Office   I discussed the limitations of evaluation and management by telemedicine and the availability of in person appointments. The patient expressed understanding and agreed to proceed.    History of Present Illness: Mackenzie Santiago is a 27 y.o. who identifies as a female who was assigned female at birth, and is being seen today for yeast infection  HPI: Vaginal Itching The patient's primary symptoms include vaginal discharge. The patient's pertinent negatives include no genital itching, genital lesions, genital odor, genital rash, missed menses, pelvic pain or vaginal bleeding. The current episode started in the past 7 days. The patient is experiencing no pain. She is not pregnant. Pertinent negatives include no abdominal pain, anorexia, back pain, chills, constipation, diarrhea, discolored urine, dysuria, fever, flank pain, frequency, headaches, hematuria, joint pain, joint swelling, nausea, painful intercourse, rash, sore throat, urgency or vomiting. Associated symptoms comments: Anal itching. The vaginal discharge was milky and white. There has been no bleeding. She has not been passing clots. She has not been passing tissue. The symptoms are aggravated by intercourse. The treatment provided no relief. She is sexually active. No, her partner does not have an STD. She uses condoms for contraception.    Problems:  Patient Active Problem List   Diagnosis Date Noted   Hyperthyroidism 06/26/2020   Obstetric labial laceration, delivered, current hospitalization 11/23/2015   SVD (spontaneous vaginal delivery) 11/22/2015   H/O anxiety state 11/22/2015   H/O sickle cell trait (FOB neg) 11/22/2015   H/O  chlamydia infection (neg TOC this pregnancy) 11/22/2015   Group B streptococcal bacteriuria 11/22/2015   H/O: depression 11/21/2015   Alcohol abuse 03/23/2014   Smoker 03/23/2014   Thyroid dysfunction 05/07/2011   Tonsillar  hypertrophy 05/07/2011   Abnormal weight loss 02/19/2011    Allergies:  Allergies  Allergen Reactions   Blueberry [Vaccinium Angustifolium] Hives        Medications:  Current Outpatient Medications:    CHLOROPHYLL PO, Take by mouth., Disp: , Rfl:    clotrimazole (LOTRIMIN) 1 % cream, Apply 1 Application topically 2 (two) times daily., Disp: 30 g, Rfl: 0   ketoconazole (NIZORAL) 2 % cream, Apply 1 application topically daily., Disp: 30 g, Rfl: 1   MACA ROOT PO, Take by mouth., Disp: , Rfl:    metroNIDAZOLE (FLAGYL) 500 MG tablet, Take 1 tablet (500 mg total) by mouth 2 (two) times daily with a meal. DO NOT CONSUME ALCOHOL WHILE TAKING THIS MEDICATION., Disp: 14 tablet, Rfl: 0   Multiple Vitamin (MULTIVITAMIN WITH MINERALS) TABS tablet, Take 1 tablet by mouth daily., Disp: , Rfl:    neomycin-polymyxin-hydrocortisone (CORTISPORIN) OTIC solution, Place 3 drops into the right ear 4 (four) times daily. X 5-7 days, Disp: 10 mL, Rfl: 0   ondansetron (ZOFRAN) 4 MG tablet, Take 1 tablet (4 mg total) by mouth every 6 (six) hours., Disp: 12 tablet, Rfl: 0   OVER THE COUNTER MEDICATION, Take 1 capsule by mouth daily. Seamoss, Disp: , Rfl:   Observations/Objective: Patient is well-developed, well-nourished in no acute distress.  Resting comfortably  at home.  Head is normocephalic, atraumatic.  No labored breathing.  Speech is clear and coherent with logical content.  Patient is alert and oriented at baseline.    Assessment and Plan:  1. Yeast vaginitis  - fluconazole (DIFLUCAN) 150 MG tablet; Take 1 tablet (150 mg total) by mouth as directed. Repeat in 3 days  Dispense: 2 tablet; Refill: 0  -good hygiene reviewed -yeast and BV causes and prevention reviewed -recommended STI tested again   Reviewed side effects, risks and benefits of medication.   Patient acknowledged agreement and understanding of the plan.   Past Medical, Surgical, Social History, Allergies, and Medications have been  Reviewed.    Follow Up Instructions: I discussed the assessment and treatment plan with the patient. The patient was provided an opportunity to ask questions and all were answered. The patient agreed with the plan and demonstrated an understanding of the instructions.  A copy of instructions were sent to the patient via MyChart unless otherwise noted below.    The patient was advised to call back or seek an in-person evaluation if the symptoms worsen or if the condition fails to improve as anticipated.  Time:  I spent 10 minutes with the patient via telehealth technology discussing the above problems/concerns.    Mackenzie Mayo, NP

## 2022-04-03 ENCOUNTER — Ambulatory Visit
Admission: RE | Admit: 2022-04-03 | Discharge: 2022-04-03 | Disposition: A | Discharge status: Home / Self Care | Payer: Medicaid Other | Source: Ambulatory Visit | Attending: Nurse Practitioner | Admitting: Nurse Practitioner

## 2022-04-03 VITALS — BP 121/77 | HR 72 | Temp 98.6°F | Resp 16

## 2022-04-03 DIAGNOSIS — G43801 Other migraine, not intractable, with status migrainosus: Secondary | ICD-10-CM | POA: Insufficient documentation

## 2022-04-03 DIAGNOSIS — N760 Acute vaginitis: Secondary | ICD-10-CM | POA: Insufficient documentation

## 2022-04-03 MED ORDER — METRONIDAZOLE 0.75 % VA GEL: 1.0000 | Freq: Every day | VAGINAL | 0 refills | Status: AC

## 2022-04-03 NOTE — ED Provider Notes (Signed)
[facility]    CSN: [id number] Arrival date & time: 04/03/22  1719      History   Chief Complaint Chief Complaint  Patient presents with   Abdominal Pain    Having std symptoms, migraines - Entered by patient   Migraine   Vaginal Discharge    HPI Mackenzie Santiago is a 27 y.o. female presents for evaluation of vaginal discharge.  Patient reports 1 week of a malodorous vaginal discharge that is similar to her previous BV infections.  She is reporting some lower abdominal pain that is intermittent and does not radiate.  States this is also typical of BV infections for her.  She denies any dysuria, fevers, STD exposure but she would like screening while here.  She states she is unable to tolerate oral metronidazole as it makes her vomit.  In addition she reports history of migraines and states she has had a migraine that is been for about a week.  It is improving with OTC Excedrin.  Denies that this is the worst headache of her life.  She started a new vape and thinks this may be triggering her migraines.  No other concerns at this time.   Abdominal Pain Associated symptoms: vaginal discharge   Migraine Associated symptoms include abdominal pain and headaches.  Vaginal Discharge Associated symptoms: abdominal pain     Past Medical History:  Diagnosis Date   Heart murmur    Hyperthyroidism    Thyroid disease    Trichimoniasis     Patient Active Problem List   Diagnosis Date Noted   Hyperthyroidism 06/26/2020   Obstetric labial laceration, delivered, current hospitalization 11/23/2015   SVD (spontaneous vaginal delivery) 11/22/2015   H/O anxiety state 11/22/2015   H/O sickle cell trait (FOB neg) 11/22/2015   H/O chlamydia infection (neg TOC this pregnancy) 11/22/2015   Group B streptococcal bacteriuria 11/22/2015   H/O: depression 11/21/2015   Alcohol abuse 03/23/2014   Smoker 03/23/2014   Thyroid dysfunction 05/07/2011   Tonsillar hypertrophy 05/07/2011    Abnormal weight loss 02/19/2011    Past Surgical History:  Procedure Laterality Date   NO PAST SURGERIES      OB History     Gravida  3   Para  1   Term  1   Preterm      AB      Living  1      SAB      IAB      Ectopic      Multiple  0   Live Births  1            Home Medications    Prior to Admission medications   Medication Sig Start Date End Date Taking? Authorizing Provider  metroNIDAZOLE (METROGEL) 0.75 % vaginal gel Place 1 Applicatorful vaginally at bedtime for 5 days. 04/03/22 04/08/22 Yes Melynda Ripple, NP  CHLOROPHYLL PO Take by mouth.    [provider]  clotrimazole (LOTRIMIN) 1 % cream Apply 1 Application topically 2 (two) times daily. 12/10/21   Ward, Lenise Arena, PA-C  fluconazole (DIFLUCAN) 150 MG tablet Take 1 tablet (150 mg total) by mouth as directed. Repeat in 3 days 01/25/22   [name], NP  ketoconazole (NIZORAL) 2 % cream Apply 1 application topically daily. 12/30/20   Sharion Balloon, FNP  MACA ROOT PO Take by mouth.    [provider]  Multiple Vitamin (MULTIVITAMIN WITH MINERALS) TABS tablet Take 1 tablet by mouth daily.  [provider]  neomycin-polymyxin-hydrocortisone (CORTISPORIN) OTIC solution Place 3 drops into the right ear 4 (four) times daily. X 5-7 days 02/22/21   Mar Daring, PA-C  ondansetron (ZOFRAN) 4 MG tablet Take 1 tablet (4 mg total) by mouth every 6 (six) hours. 12/17/20   Prosperi, Christian H, PA-C  OVER THE COUNTER MEDICATION Take 1 capsule by mouth daily. Seamoss    [provider]    Family History Family History  Problem Relation Age of Onset   Sickle cell anemia Father    Sickle cell anemia Maternal Grandfather    Thyroid disease Paternal Grandmother    Sickle cell anemia Paternal Grandfather    Hypertension Mother    Alcohol abuse Neg Hx    Arthritis Neg Hx    Asthma Neg Hx    Birth defects Neg Hx    Cancer Neg Hx    COPD Neg Hx    Depression Neg  Hx    Diabetes Neg Hx    Drug abuse Neg Hx    Early death Neg Hx    Hearing loss Neg Hx    Heart disease Neg Hx    Hyperlipidemia Neg Hx    Kidney disease Neg Hx    Learning disabilities Neg Hx    Mental illness Neg Hx    Mental retardation Neg Hx    Miscarriages / Stillbirths Neg Hx    Stroke Neg Hx    Vision loss Neg Hx    Varicose Veins Neg Hx     Social History Social History   Tobacco Use   Smoking status: Former    Packs/day: .25    Types: Cigarettes   Smokeless tobacco: Never  Vaping Use   Vaping Use: Every day  Substance Use Topics   Alcohol use: No    Comment: social   Drug use: No     Allergies   Blueberry [vaccinium angustifolium]   Review of Systems Review of Systems  Gastrointestinal:  Positive for abdominal pain.  Genitourinary:  Positive for vaginal discharge.  Neurological:  Positive for headaches.     Physical Exam Triage Vital Signs ED Triage Vitals  Enc Vitals Group     BP 04/03/22 1818 121/77     Pulse Rate 04/03/22 1818 72     Resp 04/03/22 1818 16     Temp 04/03/22 1818 98.6 F (37 C)     Temp Source 04/03/22 1818 Oral     SpO2 04/03/22 1818 97 %     Weight --      Height --      Head Circumference --      Peak Flow --      Pain Score 04/03/22 1809 7     Pain Loc --      Pain Edu? --      Excl. in [place]? --    No data found.  Updated Vital Signs BP 121/77 (BP Location: Right Arm)   Pulse 72   Temp 98.6 F (37 C) (Oral)   Resp 16   LMP 02/17/2022 (Approximate)   SpO2 97%   Visual Acuity Right Eye Distance:   Left Eye Distance:   Bilateral Distance:    Right Eye Near:   Left Eye Near:    Bilateral Near:     Physical Exam Vitals and nursing note reviewed.  Constitutional:      Appearance: Normal appearance.  HENT:     Head: Normocephalic and atraumatic.  Eyes:  Extraocular Movements: Extraocular movements intact.     Conjunctiva/sclera: Conjunctivae normal.     Pupils: Pupils are equal, round, and  reactive to light.  Cardiovascular:     Rate and Rhythm: Normal rate.  Pulmonary:     Effort: Pulmonary effort is normal.  Abdominal:     General: Bowel sounds are normal. There is no distension.     Palpations: Abdomen is soft.     Tenderness: There is no abdominal tenderness. There is no right CVA tenderness, left CVA tenderness or guarding.  Skin:    General: Skin is warm and dry.  Neurological:     General: No focal deficit present.     Mental Status: She is alert and oriented to person, place, and time.     GCS: GCS eye subscore is 4. GCS verbal subscore is 5. GCS motor subscore is 6.     Cranial Nerves: No facial asymmetry.     Motor: No weakness.  Psychiatric:        Mood and Affect: Mood normal.        Behavior: Behavior normal.      UC Treatments / Results  Labs (all labs ordered are listed, but only abnormal results are displayed) Labs Reviewed  CERVICOVAGINAL ANCILLARY ONLY    EKG   Radiology No results found.  Procedures Procedures (including critical care time)  Medications Ordered in UC Medications - No data to display  Initial Impression / Assessment and Plan / UC Course  I have reviewed the triage vital signs and the nursing notes.  Pertinent labs & imaging results that were available during my care of the patient were reviewed by me and considered in my medical decision making (see chart for details).     Vaginal swab and will contact with results.  Start MetroGel given symptoms Patient reports migraine improving with over-the-counter Excedrin.  She will continue this and follow-up with PCP if she has recurrent migraines Also advised to follow-up with gynecology given her reported repeat BV infections. Follow-up with PCP 2 to 3 days for recheck ER precautions reviewed and patient verbalized understanding Final Clinical Impressions(s) / UC Diagnoses   Final diagnoses:  Acute vaginitis  Other migraine with status migrainosus, not intractable      Discharge Instructions      Start MetroGel daily for 5 nights The clinic will contact you with results of the testing done today if they are positive Follow-up with a gynecologist if you continue to have recurrent BV infections Please go to the ER for any worsening symptoms    ED Prescriptions     Medication Sig Dispense Auth. Provider   metroNIDAZOLE (METROGEL) 0.75 % vaginal gel Place 1 Applicatorful vaginally at bedtime for 5 days. 70 g Melynda Ripple, NP      PDMP not reviewed this encounter.   Melynda Ripple, NP 04/03/22 [phone]

## 2022-04-03 NOTE — Discharge Instructions (Signed)
Start MetroGel daily for 5 nights The clinic will contact you with results of the testing done today if they are positive Follow-up with a gynecologist if you continue to have recurrent BV infections Please go to the ER for any worsening symptoms

## 2022-04-03 NOTE — ED Triage Notes (Addendum)
Pt c/o migraine with light sensitivity (started 1 week ago), lower abd pain, vaginal odor and vaginal discharge.   Home interventions: none, patient states she is not able to keep flagyl down

## 2022-04-04 LAB — CERVICOVAGINAL ANCILLARY ONLY
Bacterial Vaginitis (gardnerella): POSITIVE — AB
Candida Glabrata: NEGATIVE
Candida Vaginitis: NEGATIVE
Chlamydia: NEGATIVE
Comment: NEGATIVE
Comment: NEGATIVE
Comment: NEGATIVE
Comment: NEGATIVE
Comment: NEGATIVE
Comment: NORMAL
Neisseria Gonorrhea: NEGATIVE
Trichomonas: NEGATIVE

## 2022-05-28 ENCOUNTER — Encounter: Payer: Medicaid Other | Admitting: Licensed Clinical Social Worker

## 2022-06-07 ENCOUNTER — Ambulatory Visit (INDEPENDENT_AMBULATORY_CARE_PROVIDER_SITE_OTHER): Payer: Medicaid Other | Admitting: Obstetrics & Gynecology

## 2022-06-07 ENCOUNTER — Other Ambulatory Visit (HOSPITAL_COMMUNITY)
Admission: RE | Admit: 2022-06-07 | Discharge: 2022-06-07 | Disposition: A | Discharge status: Home / Self Care | Payer: Medicaid Other | Source: Ambulatory Visit | Attending: Obstetrics and Gynecology | Admitting: Obstetrics and Gynecology

## 2022-06-07 ENCOUNTER — Ambulatory Visit (INDEPENDENT_AMBULATORY_CARE_PROVIDER_SITE_OTHER): Payer: Medicaid Other | Admitting: Licensed Clinical Social Worker

## 2022-06-07 ENCOUNTER — Encounter: Payer: Self-pay | Admitting: Obstetrics & Gynecology

## 2022-06-07 VITALS — BP 107/72 | HR 71 | Ht 62.0 in | Wt 125.0 lb

## 2022-06-07 DIAGNOSIS — E079 Disorder of thyroid, unspecified: Secondary | ICD-10-CM

## 2022-06-07 DIAGNOSIS — Z01419 Encounter for gynecological examination (general) (routine) without abnormal findings: Secondary | ICD-10-CM

## 2022-06-07 DIAGNOSIS — Z8659 Personal history of other mental and behavioral disorders: Secondary | ICD-10-CM | POA: Diagnosis not present

## 2022-06-07 DIAGNOSIS — F439 Reaction to severe stress, unspecified: Secondary | ICD-10-CM

## 2022-06-07 NOTE — Progress Notes (Signed)
27 y.o. New GYN presents for AEX/PAP/STD screening.  C/o irregular periods; missing months x 9+ months, vaginal odor, clear, runny discharge.  Pt is trying to conceive.

## 2022-06-07 NOTE — Progress Notes (Signed)
Patient ID: Mackenzie Santiago, female   DOB: 1996-01-13, 27 y.o.   MRN: [id number]  Chief Complaint  Patient presents with   New Patient (Initial Visit)    HPI Mackenzie Santiago is a 27 y.o. female.  [id number]  Patient's last menstrual period was 05/13/2021. She has a cycle that is usually about 31 days with recent shorter duration. Trying to conceive for last 9 month. She has been told she was borderline hyperthyroid but this has not been followed for 2-3 years. Her partner fathered a child several years ago HPI  Past Medical History:  Diagnosis Date   Anxiety    Heart murmur    Hyperthyroidism    Thyroid disease    Trichimoniasis     Past Surgical History:  Procedure Laterality Date   NO PAST SURGERIES      Family History  Problem Relation Age of Onset   Hypertension Mother    Sickle cell anemia Father    Cancer Paternal Aunt    Sickle cell anemia Maternal Grandfather    Thyroid disease Paternal Grandmother    Sickle cell anemia Paternal Grandfather    Alcohol abuse Neg Hx    Arthritis Neg Hx    Asthma Neg Hx    Birth defects Neg Hx    COPD Neg Hx    Depression Neg Hx    Diabetes Neg Hx    Drug abuse Neg Hx    Early death Neg Hx    Hearing loss Neg Hx    Heart disease Neg Hx    Hyperlipidemia Neg Hx    Kidney disease Neg Hx    Learning disabilities Neg Hx    Mental illness Neg Hx    Mental retardation Neg Hx    Miscarriages / Stillbirths Neg Hx    Stroke Neg Hx    Vision loss Neg Hx    Varicose Veins Neg Hx     Social History Social History   Tobacco Use   Smoking status: Former    Packs/day: .25    Types: Cigarettes   Smokeless tobacco: Current  Vaping Use   Vaping Use: Every day   Substances: Nicotine  Substance Use Topics   Alcohol use: Yes    Comment: social   Drug use: No    Allergies  Allergen Reactions   Blueberry [Vaccinium Angustifolium] Hives         Current Outpatient Medications  Medication Sig Dispense Refill   MACA ROOT  PO Take by mouth.     Multiple Vitamin (MULTIVITAMIN WITH MINERALS) TABS tablet Take 1 tablet by mouth daily.     CHLOROPHYLL PO Take by mouth. (Patient not taking: Reported on 06/07/2022)     clotrimazole (LOTRIMIN) 1 % cream Apply 1 Application topically 2 (two) times daily. (Patient not taking: Reported on 06/07/2022) 30 g 0   fluconazole (DIFLUCAN) 150 MG tablet Take 1 tablet (150 mg total) by mouth as directed. Repeat in 3 days (Patient not taking: Reported on 06/07/2022) 2 tablet 0   ketoconazole (NIZORAL) 2 % cream Apply 1 application topically daily. (Patient not taking: Reported on 06/07/2022) 30 g 1   neomycin-polymyxin-hydrocortisone (CORTISPORIN) OTIC solution Place 3 drops into the right ear 4 (four) times daily. X 5-7 days (Patient not taking: Reported on 06/07/2022) 10 mL 0   ondansetron (ZOFRAN) 4 MG tablet Take 1 tablet (4 mg total) by mouth every 6 (six) hours. (Patient not taking: Reported on 06/07/2022) 12 tablet 0   OVER THE COUNTER MEDICATION  Take 1 capsule by mouth daily. Seamoss (Patient not taking: Reported on 06/07/2022)     No current facility-administered medications for this visit.    Review of Systems Review of Systems  Constitutional: Negative.   Respiratory: Negative.    Cardiovascular: Negative.   Gastrointestinal: Negative.     Blood pressure 107/72, pulse 71, height 5\' 2"  (1.575 m), weight 125 lb (56.7 kg), last menstrual period 05/13/2021.  Physical Exam Physical Exam Vitals and nursing note reviewed. Exam conducted with a chaperone present.  Constitutional:      Appearance: Normal appearance. She is not ill-appearing.  HENT:     Head: Normocephalic and atraumatic.  Cardiovascular:     Rate and Rhythm: Normal rate.  Pulmonary:     Effort: Pulmonary effort is normal.  Chest:  Breasts:    Right: Normal.     Left: Normal.  Genitourinary:    General: Normal vulva.     Exam position: Lithotomy position.     Vagina: Normal.     Cervix: Normal.      Uterus: Normal.      Adnexa: Right adnexa normal and left adnexa normal.  Musculoskeletal:     Cervical back: Normal range of motion.  Skin:    General: Skin is warm and dry.  Neurological:     Mental Status: She is alert.     Data Reviewed Pap result  Assessment Well woman exam with routine gynecological exam - Plan: Cytology - PAP( [place]), Cervicovaginal ancillary only( [place]), HIV antibody (with reflex), Hepatitis B Surface AntiGEN, RPR, Hepatitis C Antibody, Ambulatory referral to Integrated Behavioral Health, Thyroid Panel With TSH  Thyroid dysfunction - Plan: Cytology - PAP( [place]), Cervicovaginal ancillary only( [place]), HIV antibody (with reflex), Hepatitis B Surface AntiGEN, RPR, Hepatitis C Antibody, Ambulatory referral to Integrated Behavioral Health, Thyroid Panel With TSH   Plan Menstrual calendar on her phone. F/u labs from today. Her partner should have SA if they are unable to conceive    Mackenzie Santiago 06/07/2022, 10:10 AM

## 2022-06-08 LAB — CERVICOVAGINAL ANCILLARY ONLY
Bacterial Vaginitis (gardnerella): POSITIVE — AB
Candida Glabrata: NEGATIVE
Candida Vaginitis: NEGATIVE
Chlamydia: NEGATIVE
Comment: NEGATIVE
Comment: NEGATIVE
Comment: NEGATIVE
Comment: NEGATIVE
Comment: NEGATIVE
Comment: NORMAL
Neisseria Gonorrhea: NEGATIVE
Trichomonas: NEGATIVE

## 2022-06-08 LAB — THYROID PANEL WITH TSH
Free Thyroxine Index: 1.7 (ref 1.2–4.9)
T3 Uptake Ratio: 26 % (ref 24–39)
T4, Total: 6.4 ug/dL (ref 4.5–12.0)
TSH: 0.102 u[IU]/mL — ABNORMAL LOW (ref 0.450–4.500)

## 2022-06-08 LAB — HIV ANTIBODY (ROUTINE TESTING W REFLEX): HIV Screen 4th Generation wRfx: NONREACTIVE

## 2022-06-08 LAB — RPR: RPR Ser Ql: NONREACTIVE

## 2022-06-08 LAB — HEPATITIS B SURFACE ANTIGEN: Hepatitis B Surface Ag: NEGATIVE

## 2022-06-08 LAB — HEPATITIS C ANTIBODY: Hep C Virus Ab: NONREACTIVE

## 2022-06-13 NOTE — BH Specialist Note (Signed)
Integrated Behavioral Health Initial In-Person Visit  MRN: [id number] Name: Mackenzie Santiago  Number of Integrated Behavioral Health Clinician visits: 1 Session Start time:   10:02am Session End time: 10:30am Total time in minutes: 28 mins in person at [facility]   Types of Service: General Behavioral Integrated Care (BHI)  Interpretor:No. Interpretor Name and Language: none    Warm Hand Off Completed.        Subjective: Mackenzie Santiago is a 27 y.o. female accompanied by n/a Patient was referred by Dr Debroah Loop for elevated phq9. Patient reports the following symptoms/concerns: loss of interest, depressed mood and trouble sleeping  Duration of problem: approx 4 months ; Severity of problem: mild  Objective: Mood: good and Affect: Appropriate Risk of harm to self or others: No plan to harm self or others  Life Context: Family and Social: Lives in [place]  School/Work: employed  Self-Care: n/a Life Changes: Trying to conceive   Patient and/or Family's Strengths/Protective Factors: Concrete supports in place (healthy food, safe environments, etc.)  Goals Addressed: Patient will: Reduce symptoms of: depression Increase knowledge and/or ability of: coping skills  Demonstrate ability to: Increase healthy adjustment to current life circumstances  Progress towards Goals: Ongoing  Interventions: Interventions utilized: Supportive Counseling  Standardized Assessments completed: PHQ 9  Patient and/or Family Response: Mackenzie Santiago reports depressed mood and history of anxiety. Mackenzie Santiago reports partner is supportive. LCSW A Trigg Delarocha and Mackenzie Santiago discussed mindfulness and stress reducing activities.   Assessment: Patient currently experiencing depressed mood and situational stresss..   Patient may benefit from outpatient community mental health.  Plan: Follow up with behavioral health clinician on : as needed  Behavioral recommendations: stress reducing  activity, mindfulness, priortizing rest and self cafe.  Referral(s): Community Mental Health Services (LME/Outside Clinic) "From scale of 1-10, how likely are you to follow plan?":    Mackenzie Saxon, LCSW

## 2022-06-14 LAB — CYTOLOGY - PAP
Comment: NEGATIVE
Diagnosis: NEGATIVE
High risk HPV: NEGATIVE

## 2022-08-11 ENCOUNTER — Telehealth: Payer: MEDICAID | Admitting: Physician Assistant

## 2022-08-11 DIAGNOSIS — R112 Nausea with vomiting, unspecified: Secondary | ICD-10-CM

## 2022-08-22 NOTE — Progress Notes (Signed)
For the safety of you and your child, I recommend a face to face office visit with your OBGYN.  Many mothers need to take medicines during their pregnancy and while nursing.  Almost all medicines pass into the breast milk in small quantities.  Most are generally considered safe for a mother to take but some medicines must be avoided.  After reviewing your E-Visit request, I recommend that you consult your OB/GYN or pediatrician for medical advice in relation to your condition and prescription medications while pregnant or breastfeeding.  NOTE:  There will be NO CHARGE for this eVisit  If you are having a true medical emergency please call 911.    For an urgent face to face visit, [facility] has six urgent care centers for your convenience:     [facility] Center at [facility] Directions [phone] [address] [place], [place] [address]    [facility] Health Urgent Care Center [facility]) Get Driving Directions [phone] [address] [place], [place] [address]  [facility] Health Urgent Care Center [facility]) Get Driving Directions [phone] [address] [place],  [place]  [address]  [facility] Health Urgent Care at [facility] Get Driving Directions [phone] [address], [address] [place], [place] [address]   [facility] Health Urgent Care at [facility] Get Driving Directions  [phone] [address] [place], [place] [address]   [facility] Health Urgent Care at [facility] Directions [phone] [address], [address] [place], [place] [address]  Your MyChart E-visit questionnaire answers were reviewed by a board certified advanced clinical practitioner to complete your personal care plan based on your specific symptoms.  Thank you for using e-Visits.

## 2022-09-21 ENCOUNTER — Encounter (HOSPITAL_COMMUNITY): Payer: Self-pay | Admitting: Emergency Medicine

## 2022-09-21 ENCOUNTER — Emergency Department (HOSPITAL_COMMUNITY)
Admission: EM | Admit: 2022-09-21 | Discharge: 2022-09-21 | Disposition: A | Discharge status: Home / Self Care | Payer: MEDICAID | Attending: Emergency Medicine | Admitting: Emergency Medicine

## 2022-09-21 ENCOUNTER — Other Ambulatory Visit: Payer: Self-pay

## 2022-09-21 DIAGNOSIS — R5383 Other fatigue: Secondary | ICD-10-CM | POA: Diagnosis present

## 2022-09-21 LAB — COMPREHENSIVE METABOLIC PANEL
ALT: 15 U/L (ref 0–44)
AST: 19 U/L (ref 15–41)
Albumin: 3.9 g/dL (ref 3.5–5.0)
Alkaline Phosphatase: 61 U/L (ref 38–126)
Anion gap: 8 (ref 5–15)
BUN: 9 mg/dL (ref 6–20)
CO2: 26 mmol/L (ref 22–32)
Calcium: 8.8 mg/dL — ABNORMAL LOW (ref 8.9–10.3)
Chloride: 104 mmol/L (ref 98–111)
Creatinine, Ser: 0.61 mg/dL (ref 0.44–1.00)
GFR, Estimated: >60 mL/min (ref >60)
Glucose, Bld: 94 mg/dL (ref 70–99)
Potassium: 3.6 mmol/L (ref 3.5–5.1)
Sodium: 138 mmol/L (ref 135–145)
Total Bilirubin: 0.4 mg/dL (ref 0.3–1.2)
Total Protein: 7.1 g/dL (ref 6.5–8.1)

## 2022-09-21 LAB — CBC WITH DIFFERENTIAL/PLATELET
Abs Immature Granulocytes: 0 10*3/uL (ref 0.00–0.07)
Basophils Absolute: 0 10*3/uL (ref 0.0–0.1)
Basophils Relative: 1 %
Eosinophils Absolute: 0.2 10*3/uL (ref 0.0–0.5)
Eosinophils Relative: 3 %
HCT: 37.3 % (ref 36.0–46.0)
Hemoglobin: 12.4 g/dL (ref 12.0–15.0)
Immature Granulocytes: 0 %
Lymphocytes Relative: 49 %
Lymphs Abs: 2.7 10*3/uL (ref 0.7–4.0)
MCH: 24.9 pg — ABNORMAL LOW (ref 26.0–34.0)
MCHC: 33.2 g/dL (ref 30.0–36.0)
MCV: 75.1 fL — ABNORMAL LOW (ref 80.0–100.0)
Monocytes Absolute: 0.4 10*3/uL (ref 0.1–1.0)
Monocytes Relative: 8 %
Neutro Abs: 2.1 10*3/uL (ref 1.7–7.7)
Neutrophils Relative %: 39 %
Platelets: 291 10*3/uL (ref 150–400)
RBC: 4.97 MIL/uL (ref 3.87–5.11)
RDW: 13.3 % (ref 11.5–15.5)
WBC: 5.5 10*3/uL (ref 4.0–10.5)
nRBC: 0 % (ref 0.0–0.2)

## 2022-09-21 LAB — URINALYSIS, ROUTINE W REFLEX MICROSCOPIC
Bilirubin Urine: NEGATIVE
Glucose, UA: NEGATIVE mg/dL
Hgb urine dipstick: NEGATIVE
Ketones, ur: NEGATIVE mg/dL
Nitrite: NEGATIVE
Protein, ur: NEGATIVE mg/dL
Specific Gravity, Urine: 1.002 — ABNORMAL LOW (ref 1.005–1.030)
pH: 7 (ref 5.0–8.0)

## 2022-09-21 LAB — TSH: TSH: 0.3 u[IU]/mL — ABNORMAL LOW (ref 0.350–4.500)

## 2022-09-21 LAB — PREGNANCY, URINE: Preg Test, Ur: NEGATIVE

## 2022-09-21 NOTE — ED Triage Notes (Addendum)
Pt reports feeling tired and no appetite since the beginning of August. Denies n/v and fevers. PT does reports tenderness to the right groin when palpated.

## 2022-09-21 NOTE — ED Provider Notes (Signed)
EMERGENCY DEPARTMENT AT [facility] Provider Note   CSN: [id number] Arrival date & time: 09/21/22  1027     History  Chief Complaint  Patient presents with   Weakness    Mackenzie Santiago is a 27 y.o. female.   Weakness 63 female presented for generalized fatigue.  Patient states she is felt tired for several months and has had poor appetite.  She states about a week ago she noted a bump in the right side of her groin as well.  She looked online and is concerned she has cancer so presents for evaluation.  She is eating and drinking.  No nausea or vomiting.  No chest pain or shortness of breath or cough.  No fevers or chills.  No abdominal pain.  No vaginal bleeding or discharge or urinary symptoms.  She has not had any diarrhea.  She reports she was told she had hypothyroidism before but is never been on medication.  No rashes.  Otherwise asymptomatic.     Home Medications Prior to Admission medications   Medication Sig Start Date End Date Taking? Authorizing Provider  CHLOROPHYLL PO Take by mouth. Patient not taking: Reported on 06/07/2022    [provider]  clotrimazole (LOTRIMIN) 1 % cream Apply 1 Application topically 2 (two) times daily. Patient not taking: Reported on 06/07/2022 12/10/21   Ward, Tylene Fantasia, PA-C  fluconazole (DIFLUCAN) 150 MG tablet Take 1 tablet (150 mg total) by mouth as directed. Repeat in 3 days Patient not taking: Reported on 06/07/2022 01/25/22   Freddy Finner, NP  ketoconazole (NIZORAL) 2 % cream Apply 1 application topically daily. Patient not taking: Reported on 06/07/2022 12/30/20   Junie Spencer, FNP  MACA ROOT PO Take by mouth.    [provider]  Multiple Vitamin (MULTIVITAMIN WITH MINERALS) TABS tablet Take 1 tablet by mouth daily.    [provider]  neomycin-polymyxin-hydrocortisone (CORTISPORIN) OTIC solution Place 3 drops into the right ear 4 (four) times daily. X 5-7 days Patient not  taking: Reported on 06/07/2022 02/22/21   [name], PA-C  ondansetron (ZOFRAN) 4 MG tablet Take 1 tablet (4 mg total) by mouth every 6 (six) hours. Patient not taking: Reported on 06/07/2022 12/17/20   Prosperi, Christian H, PA-C  OVER THE COUNTER MEDICATION Take 1 capsule by mouth daily. Seamoss Patient not taking: Reported on 06/07/2022    [provider]      Allergies    Blueberry [vaccinium angustifolium]    Review of Systems   Review of Systems  Neurological:  Positive for weakness.  Review of systems completed and notable as per HPI.  ROS otherwise negative.   Physical Exam Updated Vital Signs BP 122/89 (BP Location: Right Arm)   Pulse 72   Temp 98.1 F (36.7 C) (Oral)   Resp 17   SpO2 100%  Physical Exam Vitals and nursing note reviewed. Exam conducted with a chaperone present.  Constitutional:      General: She is not in acute distress.    Appearance: She is well-developed.  HENT:     Head: Normocephalic and atraumatic.     Nose: Nose normal.     Mouth/Throat:     Mouth: Mucous membranes are moist.     Pharynx: Oropharynx is clear.  Eyes:     Extraocular Movements: Extraocular movements intact.     Conjunctiva/sclera: Conjunctivae normal.     Pupils: Pupils are equal, round, and reactive to light.  Cardiovascular:  Rate and Rhythm: Normal rate and regular rhythm.     Heart sounds: No murmur heard. Pulmonary:     Effort: Pulmonary effort is normal. No respiratory distress.     Breath sounds: Normal breath sounds.  Abdominal:     Palpations: Abdomen is soft.     Tenderness: There is no abdominal tenderness. There is no guarding or rebound.     Comments: Single right inguinal lymph node palpable, nontender, no skin changes.  No palpable hernia.  Musculoskeletal:        General: No swelling.     Cervical back: Neck supple.     Right lower leg: No edema.     Left lower leg: No edema.  Lymphadenopathy:     Cervical: No cervical adenopathy.   Skin:    General: Skin is warm and dry.     Capillary Refill: Capillary refill takes less than 2 seconds.  Neurological:     General: No focal deficit present.     Mental Status: She is alert and oriented to person, place, and time. Mental status is at baseline.  Psychiatric:        Mood and Affect: Mood normal.     ED Results / Procedures / Treatments   Labs (all labs ordered are listed, but only abnormal results are displayed) Labs Reviewed  CBC WITH DIFFERENTIAL/PLATELET - Abnormal; Notable for the following components:      Result Value   MCV 75.1 (*)    MCH 24.9 (*)    All other components within normal limits  COMPREHENSIVE METABOLIC PANEL - Abnormal; Notable for the following components:   Calcium 8.8 (*)    All other components within normal limits  TSH - Abnormal; Notable for the following components:   TSH 0.300 (*)    All other components within normal limits  URINALYSIS, ROUTINE W REFLEX MICROSCOPIC - Abnormal; Notable for the following components:   Color, Urine STRAW (*)    Specific Gravity, Urine 1.002 (*)    Leukocytes,Ua TRACE (*)    Bacteria, UA RARE (*)    All other components within normal limits  PREGNANCY, URINE    EKG None  Radiology No results found.  Procedures Procedures    Medications Ordered in ED Medications - No data to display  ED Course/ Medical Decision Making/ A&P                                  Medical Decision Making Amount and/or Complexity of Data Reviewed Labs: ordered.   Medical Decision Making:   Mackenzie Santiago is a 27 y.o. female who presented to the ED today with generalized fatigue, groin lymph node.  Vital signs reviewed.  Exam she is well-appearing, she has some generalized fatigue and weakness recently but unremarkable exam.  She reports a small bump in her right groin.  I feel a small nontender lymph node, but no other acute abnormality.  Abdominal exam is benign.  She is no vaginal bleeding or  discharge, low concern for STI, PID.  She has no swelling or pain in her legs.  Will plan to obtain basic lab workup for evaluation.   Patient placed on continuous vitals and telemetry monitoring while in ED which was reviewed periodically.  Reviewed and confirmed nursing documentation for past medical history, family history, social history.  Reassessment and Plan:   On reassessment she is asymptomatic.  Lab work is reassuring.  She is somewhere bacteria but no leukocytes and no urinary symptoms.  TSH slightly low although similar to prior and not consistent with her presentation.  She has single nontender inguinal lymph node on the right on exam, no signs of infection.  I recommend she follow-up closely with her PCP for further evaluation as this does need further workup.  She was understanding and comfortable with this plan and voiced understanding that she would need further workup for her lymphadenopathy and fatigue.  Strict turn precautions given.   Patient's presentation is most consistent with acute complicated illness / injury requiring diagnostic workup.           Final Clinical Impression(s) / ED Diagnoses Final diagnoses:  Other fatigue    Rx / DC Orders ED Discharge Orders     None         Laurence Spates, MD 09/21/22 1827

## 2022-09-21 NOTE — Discharge Instructions (Signed)
Your lab work today was reassuring other than slightly low thyroid-stimulating hormone.  You need to call the number below to schedule follow-up with a primary care provider as you will need further workup and evaluation.  Specifically will need reevaluation of the enlarged lymph node that we discussed.  If this gets larger, becomes severely painful or you develop any other new concerning symptoms you should return to the ED.

## 2022-10-05 ENCOUNTER — Encounter (INDEPENDENT_AMBULATORY_CARE_PROVIDER_SITE_OTHER): Payer: Self-pay | Admitting: Primary Care

## 2022-10-05 ENCOUNTER — Ambulatory Visit (INDEPENDENT_AMBULATORY_CARE_PROVIDER_SITE_OTHER): Payer: MEDICAID | Admitting: Primary Care

## 2022-10-05 VITALS — BP 113/81 | HR 87 | Resp 16 | Ht 62.0 in | Wt 128.0 lb

## 2022-10-05 DIAGNOSIS — Z833 Family history of diabetes mellitus: Secondary | ICD-10-CM | POA: Diagnosis not present

## 2022-10-05 DIAGNOSIS — Z09 Encounter for follow-up examination after completed treatment for conditions other than malignant neoplasm: Secondary | ICD-10-CM

## 2022-10-05 DIAGNOSIS — R7989 Other specified abnormal findings of blood chemistry: Secondary | ICD-10-CM | POA: Diagnosis not present

## 2022-10-05 DIAGNOSIS — Z7689 Persons encountering health services in other specified circumstances: Secondary | ICD-10-CM | POA: Diagnosis not present

## 2022-10-06 LAB — TSH+FREE T4
Free T4: 1.2 ng/dL (ref 0.82–1.77)
TSH: 0.261 u[IU]/mL — ABNORMAL LOW (ref 0.450–4.500)

## 2022-10-06 LAB — HEMOGLOBIN A1C
Est. average glucose Bld gHb Est-mCnc: 108 mg/dL
Hgb A1c MFr Bld: 5.4 % (ref 4.8–5.6)

## 2022-10-06 LAB — T3, FREE: T3, Free: 4.1 pg/mL (ref 2.0–4.4)

## 2022-10-08 NOTE — Progress Notes (Signed)
[facility]   Subjective:   Mackenzie Santiago is a 27 y.o. female presents for hospital follow up and establish care.  She presented to the emergency room on admit 09/21/22, fatigue.  Patient was concern that she is felt tired for several months and has had poor appetite.  Also she noted a bump in the right side of her groin.  She was discharged with a diagnosis of fatigue and to follow-up and establish care with [facility].  She has had a history of thyroid we will check levels to rule out cause of fatigue.  Pelvic pain will be followed by OB/GYN. Patient has No headache, No chest pain, No abdominal pain - No Nausea, No new weakness tingling or numbness, No Cough - shortness of breath  Past Medical History:  Diagnosis Date   Anxiety    Heart murmur    Hyperthyroidism    Thyroid disease    Trichimoniasis      Allergies  Allergen Reactions   Blueberry [Vaccinium Angustifolium] Hives           Current Outpatient Medications on File Prior to Visit  Medication Sig Dispense Refill   MACA ROOT PO Take by mouth.     Multiple Vitamin (MULTIVITAMIN WITH MINERALS) TABS tablet Take 1 tablet by mouth daily.     CHLOROPHYLL PO Take by mouth. (Patient not taking: Reported on 06/07/2022)     clotrimazole (LOTRIMIN) 1 % cream Apply 1 Application topically 2 (two) times daily. (Patient not taking: Reported on 06/07/2022) 30 g 0   fluconazole (DIFLUCAN) 150 MG tablet Take 1 tablet (150 mg total) by mouth as directed. Repeat in 3 days (Patient not taking: Reported on 06/07/2022) 2 tablet 0   ketoconazole (NIZORAL) 2 % cream Apply 1 application topically daily. (Patient not taking: Reported on 06/07/2022) 30 g 1   neomycin-polymyxin-hydrocortisone (CORTISPORIN) OTIC solution Place 3 drops into the right ear 4 (four) times daily. X 5-7 days (Patient not taking: Reported on 06/07/2022) 10 mL 0   ondansetron (ZOFRAN) 4 MG tablet Take 1 tablet (4 mg total) by mouth every  6 (six) hours. (Patient not taking: Reported on 06/07/2022) 12 tablet 0   OVER THE COUNTER MEDICATION Take 1 capsule by mouth daily. Seamoss (Patient not taking: Reported on 06/07/2022)     No current facility-administered medications on file prior to visit.     Review of System: Comprehensive ROS Pertinent positive and negative noted in HPI    Objective:  BP 113/81 (BP Location: Left Arm, Patient Position: Sitting, Cuff Size: Large)   Pulse 87   Resp 16   Ht 5\' 2"  (1.575 m)   Wt 128 lb (58.1 kg)   LMP 09/11/2022 (Exact Date)   SpO2 98%   BMI 23.41 kg/m   Filed Weights   10/05/22 1124  Weight: 128 lb (58.1 kg)    Physical Exam:   General Appearance: Well nourished, in no apparent distress. Eyes: PERRLA, EOMs, conjunctiva no swelling or erythema Sinuses: No Frontal/maxillary tenderness ENT/Mouth: Ext aud canals clear, TMs without erythema, bulging. No erythema, swelling, or exudate on post pharynx.  Tonsils not swollen or erythematous. Hearing normal.  Neck: Supple, thyroid normal.  Respiratory: Respiratory effort normal, BS equal bilaterally without rales, rhonchi, wheezing or stridor.  Cardio: RRR with no MRGs. Brisk peripheral pulses without edema.  Abdomen: Soft, + BS.  Non tender, no guarding, rebound, hernias, masses. Lymphatics: Non tender without lymphadenopathy.  Musculoskeletal: Full ROM, 5/5 strength, normal  gait.  Skin: Warm, dry without rashes, lesions, ecchymosis.  Neuro: Cranial nerves intact. Normal muscle tone, no cerebellar symptoms. Sensation intact.  Psych: Awake and oriented X 3, normal affect, Insight and Judgment appropriate.    Assessment:  Jasmina was seen today for hospitalization follow-up.  Diagnoses and all orders for this visit:  low TSH -     TSH + free T4 -     T3, Free  Family history of diabetes mellitus -     Hemoglobin A1c 5.4-explained . Prediabetes is 5.7-6.4 monitor carbohydrates -rice, potatoes, tortillas, breads, pasta,  sweets, sodas.  Increase exercising to help maintain appropriate weight.   Encounter to establish care  Hospital discharge follow-up Establish PCP will follow-up with GYN for pelvic pain     This note has been created with Dragon speech recognition software and smart phrase technology. Any transcriptional errors are unintentional.   Grayce Sessions, NP 10/08/2022, 2:39 PM

## 2022-10-09 ENCOUNTER — Ambulatory Visit (INDEPENDENT_AMBULATORY_CARE_PROVIDER_SITE_OTHER): Payer: MEDICAID | Admitting: Obstetrics & Gynecology

## 2022-10-09 ENCOUNTER — Other Ambulatory Visit (HOSPITAL_COMMUNITY)
Admission: RE | Admit: 2022-10-09 | Discharge: 2022-10-09 | Disposition: A | Discharge status: Home / Self Care | Payer: MEDICAID | Source: Ambulatory Visit | Attending: Obstetrics & Gynecology | Admitting: Obstetrics & Gynecology

## 2022-10-09 VITALS — BP 94/62 | HR 60 | Wt 125.0 lb

## 2022-10-09 DIAGNOSIS — R102 Pelvic and perineal pain: Secondary | ICD-10-CM

## 2022-10-09 NOTE — Progress Notes (Signed)
Pt was having some lower pelvic pain, diarrhea , enlarged lymph node in groin area - better.  Pt also complains of frequent BV - pt feels it's every month.  Pt would like all testing today- full swab with sti testing - pt will do self swab today.

## 2022-10-09 NOTE — Progress Notes (Signed)
Patient ID: Mackenzie Santiago, female   DOB: [birth year], 27 y.o.   MRN: [id number]  Chief Complaint  Patient presents with   Gynecologic Exam    HPI Lakea Santiago is a 27 y.o. female.  [id number] Patient's last menstrual period was 09/11/2022 (exact date). She recently had lower pelvic pain, diarrhea , enlarged lymph node in groin area which resolved but she wants STD screen and testing for BV as she has had frequent recurrences after treatment with metronidazole HPI  Past Medical History:  Diagnosis Date   Anxiety    Heart murmur    Hyperthyroidism    Thyroid disease    Trichimoniasis     Past Surgical History:  Procedure Laterality Date   NO PAST SURGERIES      Family History  Problem Relation Age of Onset   Hypertension Mother    Sickle cell anemia Father    Cancer Paternal Aunt    Sickle cell anemia Maternal Grandfather    Thyroid disease Paternal Grandmother    Sickle cell anemia Paternal Grandfather    Alcohol abuse Neg Hx    Arthritis Neg Hx    Asthma Neg Hx    Birth defects Neg Hx    COPD Neg Hx    Depression Neg Hx    Diabetes Neg Hx    Drug abuse Neg Hx    Early death Neg Hx    Hearing loss Neg Hx    Heart disease Neg Hx    Hyperlipidemia Neg Hx    Kidney disease Neg Hx    Learning disabilities Neg Hx    Mental illness Neg Hx    Mental retardation Neg Hx    Miscarriages / Stillbirths Neg Hx    Stroke Neg Hx    Vision loss Neg Hx    Varicose Veins Neg Hx     Social History Social History   Tobacco Use   Smoking status: Former    Current packs/day: 0.25    Types: Cigarettes   Smokeless tobacco: Current  Vaping Use   Vaping status: Every Day   Substances: Nicotine  Substance Use Topics   Alcohol use: Yes    Comment: social   Drug use: No    Allergies  Allergen Reactions   Blueberry [Vaccinium Angustifolium] Hives         Current Outpatient Medications  Medication Sig Dispense Refill   CHLOROPHYLL PO Take by mouth. (Patient not  taking: Reported on 06/07/2022)     clotrimazole (LOTRIMIN) 1 % cream Apply 1 Application topically 2 (two) times daily. (Patient not taking: Reported on 06/07/2022) 30 g 0   ketoconazole (NIZORAL) 2 % cream Apply 1 application topically daily. (Patient not taking: Reported on 06/07/2022) 30 g 1   MACA ROOT PO Take by mouth.     Multiple Vitamin (MULTIVITAMIN WITH MINERALS) TABS tablet Take 1 tablet by mouth daily.     neomycin-polymyxin-hydrocortisone (CORTISPORIN) OTIC solution Place 3 drops into the right ear 4 (four) times daily. X 5-7 days (Patient not taking: Reported on 06/07/2022) 10 mL 0   ondansetron (ZOFRAN) 4 MG tablet Take 1 tablet (4 mg total) by mouth every 6 (six) hours. (Patient not taking: Reported on 06/07/2022) 12 tablet 0   OVER THE COUNTER MEDICATION Take 1 capsule by mouth daily. Seamoss (Patient not taking: Reported on 06/07/2022)     No current facility-administered medications for this visit.    Review of Systems Review of Systems  Gastrointestinal: Negative.   Genitourinary:  Positive  for pelvic pain (better now) and vaginal discharge. Negative for menstrual problem.  She has not conceived with her partner of 2 years. He has no children  Blood pressure 94/62, pulse 60, weight 125 lb (56.7 kg), last menstrual period 09/11/2022.  Physical Exam Physical Exam Vitals and nursing note reviewed.  Constitutional:      Appearance: Normal appearance.  HENT:     Head: Normocephalic and atraumatic.  Cardiovascular:     Rate and Rhythm: Normal rate.  Pulmonary:     Effort: Pulmonary effort is normal.  Abdominal:     General: Abdomen is flat.     Palpations: Abdomen is soft.     Tenderness: There is no abdominal tenderness.  Musculoskeletal:     Cervical back: Normal range of motion.  Skin:    General: Skin is warm and dry.  Neurological:     Mental Status: She is alert.  Psychiatric:        Mood and Affect: Mood normal.        Behavior: Behavior normal.     Data  Reviewed Thyroid test results STD test results  Assessment Recurrent BV Infertility with present partner  Plan F/u lab results today Recommend that her partner needs SA       Mackenzie Santiago 10/09/2022, 2:09 PM

## 2022-10-10 LAB — CERVICOVAGINAL ANCILLARY ONLY
Bacterial Vaginitis (gardnerella): POSITIVE — AB
Candida Glabrata: NEGATIVE
Candida Vaginitis: NEGATIVE
Chlamydia: NEGATIVE
Comment: NEGATIVE
Comment: NEGATIVE
Comment: NEGATIVE
Comment: NEGATIVE
Comment: NEGATIVE
Comment: NORMAL
Neisseria Gonorrhea: NEGATIVE
Trichomonas: NEGATIVE

## 2022-10-11 MED ORDER — METRONIDAZOLE 500 MG PO TABS: 500.0000 mg | ORAL_TABLET | Freq: Two times a day (BID) | ORAL | 0 refills | Status: DC

## 2022-10-11 NOTE — Addendum Note (Signed)
Addended by: Adam Phenix on: 10/11/2022 02:48 PM   Modules accepted: Orders

## 2022-11-15 ENCOUNTER — Telehealth (INDEPENDENT_AMBULATORY_CARE_PROVIDER_SITE_OTHER): Payer: Self-pay | Admitting: Primary Care

## 2022-11-15 NOTE — Telephone Encounter (Signed)
I called pt to confirm apt with [place] at 930.

## 2022-11-16 ENCOUNTER — Ambulatory Visit (INDEPENDENT_AMBULATORY_CARE_PROVIDER_SITE_OTHER): Payer: MEDICAID | Admitting: Primary Care

## 2022-11-16 ENCOUNTER — Encounter (INDEPENDENT_AMBULATORY_CARE_PROVIDER_SITE_OTHER): Payer: Self-pay | Admitting: Primary Care

## 2022-11-16 VITALS — BP 113/76 | HR 109 | Resp 16 | Wt 123.4 lb

## 2022-11-16 DIAGNOSIS — R7989 Other specified abnormal findings of blood chemistry: Secondary | ICD-10-CM | POA: Diagnosis not present

## 2022-11-16 DIAGNOSIS — Z2821 Immunization not carried out because of patient refusal: Secondary | ICD-10-CM

## 2022-11-16 DIAGNOSIS — N898 Other specified noninflammatory disorders of vagina: Secondary | ICD-10-CM | POA: Diagnosis not present

## 2022-11-16 DIAGNOSIS — Z23 Encounter for immunization: Secondary | ICD-10-CM | POA: Diagnosis not present

## 2022-11-16 NOTE — Progress Notes (Signed)
   Established Patient Office Visit  Subjective   Patient ID: Mackenzie Santiago, female    DOB: 04-May-1995  Age: 27 y.o. MRN: [id number]  Chief Complaint  Patient presents with   Thyroid Problem    6 week f/u  Ms.Mackenzie Santiago She is her for a thyroid level and check up, She denies  slow metabolism, fatigue, weight gain, dry hair/skin, constipation, swelling, decreased memory/brain fog.  She voices concerns with anxiety and prefers not to take medication. She is followed by [facility] mental health.  Consulted with OB/GYN with the quadrant use of boric acid.  Explained this was not medication that was to be used daily.  ROS Comprehensive ROS Pertinent positive and negative noted in HPI     Objective:   BP 113/76   Pulse (!) 109   Resp 16   Wt 123 lb 6.4 oz (56 kg)   SpO2 100%   BMI 22.57 kg/m   Health Maintenance  Topic Date Due   COVID-19 Vaccine (1) Never done   HPV VACCINES (2 - 2-dose series) 04/06/2009   INFLUENZA VACCINE  04/15/2023 (Originally 08/16/2022)   DTaP/Tdap/Td (2 - Td or Tdap) 05/15/2024   Cervical Cancer Screening (Pap smear)  06/06/2025   Hepatitis C Screening  Completed   HIV Screening  Completed    Physical Exam General: No apparent distress. Eyes: Extraocular eye movements intact, pupils equal and round. Neck: Supple, trachea midline. Thyroid: No enlargement, mobile without fixation, no tenderness. Cardiovascular: Regular rhythm and rate, no murmur, normal radial pulses. Respiratory: Normal respiratory effort, clear to auscultation. Gastrointestinal: Normal pitch active bowel sounds, nontender abdomen without distention or appreciable hepatomegaly. Musculoskeletal: Normal muscle tone, no tenderness on palpation of tibia, no excessive thoracic kyphosis. Skin: Appropriate warmth, no visible rash. Mental status: Alert, conversant, speech clear, thought logical, appropriate mood and affect, no hallucinations or delusions evident. Hematologic/lymphatic:  No cervical adenopathy, no visible ecchymoses.   No results found for any visits on 11/16/22.   The ASCVD Risk score (Arnett DK, et al., 2019) failed to calculate for the following reasons:   The 2019 ASCVD risk score is only valid for ages 73 to 59    Assessment & Plan:  [name] was seen today for thyroid problem.  Diagnoses and all orders for this visit:  Influenza vaccination declined  Elevated TSH -     TSH + free T4 -     T3, Free  Vaginal discharge Uses boric  acid daily Has BV was not given medication due to refusal to take it  Other orders -     HPV 9-valent vaccine,Recombinat    Grayce Sessions, NP

## 2022-11-16 NOTE — Patient Instructions (Addendum)
Alternative Vaginitis Therapies  1) soak in tub of warm water waist high with 1/2 cup of baking soda in water for ~ 20 mins. 2) soak 3 tampons in 1 tablespoon of fractionated (liquid form) coconut oil with 10 drops of Melaleuca (Tea Tree) essential oil, insert 1 saturated tampon vaginally at bedtime x 3 days. Both options are to be done after sexual intercourse, menses and when suspects Bacterial Vaginosis and/or yeast infection. Please be advised that these alternatives will not replace the need to be evaluated, if symptoms persist. You will need to seek care at an OB/GYN provider.  GO WHITE: Soap: UNSCENTED Dove (white box light green writing) Laundry detergent (underwear)- Dreft or [employer] unscented WHITE 100% cotton panties (NOT just cotton crouch) Sanitary napkin/panty liners: UNSCENTED.  If it doesn't SAY unscented it can have a scent/perfume    NO PERFUMES OR LOTIONS OR POTIONS in the vulvar area (may use regular [place]) Condoms: hypoallergenic only. Non dyed (no color) Toilet papers: white only Wash clothes: use a separate wash cloth. WHITE.  Wash in [place].   You can purchase Tea Tree Oil locally at:  [employer] [address] [place], [place] [address] [phone]  [facility] [address] [place], [place] [address] [phone]    HPV Vaccine Information for Parents  Human papillomavirus (HPV) is a common virus. It spreads easily from person to person through skin-to-skin or sexual contact. There are many types of HPV viruses. Genital or mucosal HPV can cause warts in the genitals. Cutaneous or nonmucosal HPV can cause warts on the hands or feet. Some genital HPV types may cause cancer. Your child can get a shot to help prevent the HPV types that can cause cancer, genital warts, or warts near the opening of the butt (anus). The vaccine is safe and effective. It is recommended that your child get the vaccine at about 85-30 years of age. Getting the  vaccine before your child is sexually active gives them the best protection from HPV through adulthood. How can HPV affect my child? An infection with HPV can cause: Genital warts. Mouth or throat cancer. Cancer of the anus. Cancer of the lowest part of the uterus (cervix), outer female genital area (vulva), or vagina. Cancer of the penis (penile cancer). During pregnancy, HPV can be passed to the baby. This infection can cause warts to form in the baby's throat and mouth. What actions can I take to lower my child's risk for HPV? Have your child get the HPV vaccine before they become sexually active. The best time to get the shot is at around 53-70 years of age. The vaccine may be given to children as young as 39 years old.  If your child gets the vaccine before they are 57 years old, it can be given as 2 shots, 6-12 months apart. In some cases, 3 doses are needed. Your child may need 3 doses if: They get the first dose before they are 27 years old but do not have a second dose within 6-12 months after the first dose. They get their first dose after they are 27 years old. They will need to get the other 2 doses within 6 months of the first dose. They have a weak body defense system (immune system). What are the risks and benefits of the HPV vaccine? Benefits Getting the vaccine can help prevent certain cancers. These include: Oral cancer. This is cancer of the mouth. Anal cancer. This is cancer of the  anus. Cancers of the cervix, vulva, and vagina in females. Penile cancer in males. Your child is less likely to get these cancers if they get the vaccine before they become sexually active. The vaccine also prevents genital warts caused by HPV. Risks In rare cases, side effects and reactions have been reported. These include: Soreness, redness, or swelling at the injection site. Dizziness or fainting. Fever. Headache. Muscle or joint pain. Who should not get the HPV vaccine or wait to get  it? Some children should not get the HPV vaccine or should wait to get it. Ask the health care provider if your child should get the vaccine if: Your child has had a severe allergic reaction to other vaccines. Your child is allergic to yeast. Your child has a fever. Your child has had a recent illness. Your child is pregnant or may be pregnant. Where to find more information Centers for Disease Control and Prevention (CDC): TonerPromos.no American Academy of Pediatrics (AAP): healthychildren.org This information is not intended to replace advice given to you by your health care provider. Make sure you discuss any questions you have with your health care provider. Document Revised: 09/29/2021 Document Reviewed: 09/29/2021 Elsevier Patient Education  2024 [employer].

## 2022-12-10 ENCOUNTER — Ambulatory Visit (INDEPENDENT_AMBULATORY_CARE_PROVIDER_SITE_OTHER): Payer: Self-pay | Admitting: *Deleted

## 2022-12-10 NOTE — Telephone Encounter (Addendum)
Attempted to call patient- no answer- unable to leave call back message- mailbox full.   Summary: BV   Patient states that she has BV and the yogurt her pcp told her to use is not working. Patient would like to try something else.

## 2022-12-10 NOTE — Telephone Encounter (Signed)
  Chief Complaint: requesting medication , probiotics, or any medication to help with BV Symptoms: vaginal discharge continues. Patient reports she did not like to take medication flagyl prescribed by GYN. Has been trying borac acid and is not effective. Has been eating probiotic yogurt and feels she still has sx of  BV.  Frequency: na  Pertinent Negatives: Patient denies na  Disposition: [] ED /[] Urgent Care (no appt availability in office) / [] Appointment(In office/virtual)/ []  [facility]/ [] Home Care/ [] Refused Recommended Disposition /[] [facility]/ [x]  Follow-up with PCP Additional Notes:   Patient asking for prescribed probiotics. Attempted to educate patient if she is able to tolerate flagyl as prescribed and eating probiotic yogurt or taking pill form that may help with side effects. Borac acid may take longer amount of time to work. Please advise . Patient reports she will also try to contact GYN . Patient would like a call back.    Summary: BV   Patient states that she has BV and the yogurt her pcp told her to use is not working. Patient would like to try something else.                Reason for Disposition  Prescription request for new medicine (not a refill)  Answer Assessment - Initial Assessment Questions 1. NAME of MEDICINE: "What medicine(s) are you calling about?"     Probiotics/ flagyl 2. QUESTION: "What is your question?" (e.g., double dose of medicine, side effect)     Can PCP prescribed probiotics? Was prescribed flagyl and patient reports she did not like side effects of worsening vaginal discharge. Did not take flagyl prescribed by GYN.  3. PRESCRIBER: "Who prescribed the medicine?" Reason: if prescribed by specialist, call should be referred to that group.     Flagyl prescribed by J. Debroah Loop, MD 4. SYMPTOMS: "Do you have any symptoms?" If Yes, ask: "What symptoms are you having?"  "How bad are the symptoms (e.g., mild, moderate,  severe)     Reports probiotic yogurt not working for BV. Used "borac acid" and helped some but did not get red of sx 5. PREGNANCY:  "Is there any chance that you are pregnant?" "When was your last menstrual period?"     na  Protocols used: Medication Question Call-A-AH

## 2022-12-11 ENCOUNTER — Ambulatory Visit (INDEPENDENT_AMBULATORY_CARE_PROVIDER_SITE_OTHER): Payer: MEDICAID | Admitting: Primary Care

## 2022-12-11 NOTE — Telephone Encounter (Signed)
Returned pt call and made aware that she will need to reach out to GYN and make them aware that she never picked up rx from Sept for the Flagyl. Pt states she is not bleeding anymore and to cancel the 12/2 appt. Pt doesn't have any questions or concerns

## 2022-12-17 ENCOUNTER — Ambulatory Visit (INDEPENDENT_AMBULATORY_CARE_PROVIDER_SITE_OTHER): Payer: MEDICAID | Admitting: Primary Care

## 2022-12-18 ENCOUNTER — Telehealth: Payer: MEDICAID

## 2022-12-24 ENCOUNTER — Telehealth: Payer: MEDICAID | Admitting: Physician Assistant

## 2022-12-24 DIAGNOSIS — N760 Acute vaginitis: Secondary | ICD-10-CM | POA: Diagnosis not present

## 2022-12-24 DIAGNOSIS — B9689 Other specified bacterial agents as the cause of diseases classified elsewhere: Secondary | ICD-10-CM | POA: Diagnosis not present

## 2022-12-24 MED ORDER — METRONIDAZOLE 0.75 % VA GEL: 1.0000 | Freq: Every day | VAGINAL | 0 refills | Status: DC

## 2022-12-24 NOTE — Progress Notes (Signed)
Virtual Visit Consent   Mackenzie Santiago, you are scheduled for a virtual visit with a [facility] provider today. Just as with appointments in the office, your consent must be obtained to participate. Your consent will be active for this visit and any virtual visit you may have with one of our providers in the next 365 days. If you have a MyChart account, a copy of this consent can be sent to you electronically.  As this is a virtual visit, video technology does not allow for your provider to perform a traditional examination. This may limit your provider's ability to fully assess your condition. If your provider identifies any concerns that need to be evaluated in person or the need to arrange testing (such as labs, EKG, etc.), we will make arrangements to do so. Although advances in technology are sophisticated, we cannot ensure that it will always work on either your end or our end. If the connection with a video visit is poor, the visit may have to be switched to a telephone visit. With either a video or telephone visit, we are not always able to ensure that we have a secure connection.  By engaging in this virtual visit, you consent to the provision of healthcare and authorize for your insurance to be billed (if applicable) for the services provided during this visit. Depending on your insurance coverage, you may receive a charge related to this service.  I need to obtain your verbal consent now. Are you willing to proceed with your visit today? Mackenzie Santiago has provided verbal consent on 12/24/2022 for a virtual visit (video or telephone). Mackenzie Loveless, PA-C  Date: 12/24/2022 7:48 AM  Virtual Visit via Video Note   I, Mackenzie Santiago, connected with  Mackenzie Santiago  ([id number], [birth year]) on 12/24/22 at  7:45 AM EST by a video-enabled telemedicine application and verified that I am speaking with the correct person using two identifiers.  Location: Patient: Virtual Visit  Location Patient: Home Provider: Virtual Visit Location Provider: Home Office   I discussed the limitations of evaluation and management by telemedicine and the availability of in person appointments. The patient expressed understanding and agreed to proceed.    History of Present Illness: Mackenzie Santiago is a 27 y.o. who identifies as a female who was assigned female at birth, and is being seen today for vaginal discharge.  HPI: Vaginal Discharge The patient's primary symptoms include a genital odor and vaginal discharge. The patient's pertinent negatives include no genital itching, genital lesions or genital rash. This is a new problem. The current episode started more than 1 month ago. The problem occurs constantly. The problem has been gradually worsening. The pain is mild. Pertinent negatives include no abdominal pain, dysuria, fever, flank pain, nausea or vomiting. The vaginal discharge was white, thin, milky and malodorous. There has been no bleeding. She has not been passing clots. She has not been passing tissue. Nothing aggravates the symptoms. She has tried nothing for the symptoms. The treatment provided no relief. She is not sexually active.     Problems:  Patient Active Problem List   Diagnosis Date Noted   Hyperthyroidism 06/26/2020   Obstetric labial laceration, delivered, current hospitalization 11/23/2015   H/O anxiety state 11/22/2015   H/O sickle cell trait (FOB neg) 11/22/2015   H/O: depression 11/21/2015   Alcohol abuse 03/23/2014   Smoker 03/23/2014   Thyroid dysfunction 05/07/2011   Tonsillar hypertrophy 05/07/2011   Abnormal weight loss 02/19/2011    Allergies:  Allergies  Allergen Reactions   Blueberry [Vaccinium Angustifolium] Hives        Medications:  Current Outpatient Medications:    CHLOROPHYLL PO, Take by mouth. (Patient not taking: Reported on 06/07/2022), Disp: , Rfl:    clotrimazole (LOTRIMIN) 1 % cream, Apply 1 Application topically 2 (two)  times daily. (Patient not taking: Reported on 06/07/2022), Disp: 30 g, Rfl: 0   ketoconazole (NIZORAL) 2 % cream, Apply 1 application topically daily. (Patient not taking: Reported on 06/07/2022), Disp: 30 g, Rfl: 1   MACA ROOT PO, Take by mouth., Disp: , Rfl:    metroNIDAZOLE (METROGEL) 0.75 % vaginal gel, Place 1 Applicatorful vaginally at bedtime., Disp: 70 g, Rfl: 0   Multiple Vitamin (MULTIVITAMIN WITH MINERALS) TABS tablet, Take 1 tablet by mouth daily., Disp: , Rfl:    neomycin-polymyxin-hydrocortisone (CORTISPORIN) OTIC solution, Place 3 drops into the right ear 4 (four) times daily. X 5-7 days (Patient not taking: Reported on 06/07/2022), Disp: 10 mL, Rfl: 0   ondansetron (ZOFRAN) 4 MG tablet, Take 1 tablet (4 mg total) by mouth every 6 (six) hours. (Patient not taking: Reported on 06/07/2022), Disp: 12 tablet, Rfl: 0   OVER THE COUNTER MEDICATION, Take 1 capsule by mouth daily. Seamoss (Patient not taking: Reported on 06/07/2022), Disp: , Rfl:   Observations/Objective: Patient is well-developed, well-nourished in no acute distress.  Resting comfortably at home.  Head is normocephalic, atraumatic.  No labored breathing.  Speech is clear and coherent with logical content.  Patient is alert and oriented at baseline.    Assessment and Plan: 1. BV (bacterial vaginosis) - metroNIDAZOLE (METROGEL) 0.75 % vaginal gel; Place 1 Applicatorful vaginally at bedtime.  Dispense: 70 g; Refill: 0  - Symptoms consistent with BV - Metrogel prescribed - Limit bubble baths, scented lotions/soaps/detergents - Limit tight fitting clothing - Seek on person evaluation if not improving or if symptoms worsen   Follow Up Instructions: I discussed the assessment and treatment plan with the patient. The patient was provided an opportunity to ask questions and all were answered. The patient agreed with the plan and demonstrated an understanding of the instructions.  A copy of instructions were sent to the  patient via MyChart unless otherwise noted below.    The patient was advised to call back or seek an in-person evaluation if the symptoms worsen or if the condition fails to improve as anticipated.    Mackenzie Loveless, PA-C

## 2022-12-24 NOTE — Patient Instructions (Signed)
[employer], thank you for joining [name], PA-C for today's virtual visit.  While this provider is not your primary care provider (PCP), if your PCP is located in our provider database this encounter information will be shared with them immediately following your visit.   A [facility] MyChart account gives you access to today's visit and all your visits, tests, and labs performed at [facility] " click here if you don't have a [facility] MyChart account or go to [email or web address]  Consent: (Patient) Mackenzie Santiago provided verbal consent for this virtual visit at the beginning of the encounter.  Current Medications:  Current Outpatient Medications:    CHLOROPHYLL PO, Take by mouth. (Patient not taking: Reported on 06/07/2022), Disp: , Rfl:    clotrimazole (LOTRIMIN) 1 % cream, Apply 1 Application topically 2 (two) times daily. (Patient not taking: Reported on 06/07/2022), Disp: 30 g, Rfl: 0   ketoconazole (NIZORAL) 2 % cream, Apply 1 application topically daily. (Patient not taking: Reported on 06/07/2022), Disp: 30 g, Rfl: 1   MACA ROOT PO, Take by mouth., Disp: , Rfl:    metroNIDAZOLE (METROGEL) 0.75 % vaginal gel, Place 1 Applicatorful vaginally at bedtime., Disp: 70 g, Rfl: 0   Multiple Vitamin (MULTIVITAMIN WITH MINERALS) TABS tablet, Take 1 tablet by mouth daily., Disp: , Rfl:    neomycin-polymyxin-hydrocortisone (CORTISPORIN) OTIC solution, Place 3 drops into the right ear 4 (four) times daily. X 5-7 days (Patient not taking: Reported on 06/07/2022), Disp: 10 mL, Rfl: 0   ondansetron (ZOFRAN) 4 MG tablet, Take 1 tablet (4 mg total) by mouth every 6 (six) hours. (Patient not taking: Reported on 06/07/2022), Disp: 12 tablet, Rfl: 0   OVER THE COUNTER MEDICATION, Take 1 capsule by mouth daily. Seamoss (Patient not taking: Reported on 06/07/2022), Disp: , Rfl:    Medications ordered in this encounter:  Meds ordered this encounter  Medications    metroNIDAZOLE (METROGEL) 0.75 % vaginal gel    Sig: Place 1 Applicatorful vaginally at bedtime.    Dispense:  70 g    Refill:  0    Order Specific Question:   Supervising Provider    Answer:   Merrilee Jansky [id number]     *If you need refills on other medications prior to your next appointment, please contact your pharmacy*  Follow-Up: Call back or seek an in-person evaluation if the symptoms worsen or if the condition fails to improve as anticipated.  [facility] [phone]  Other Instructions Vaginal Probiotics: AZO vaginal probiotic OLLY Happy Hoo-Ha RAW Vaginal Care RenewLife Women's vaginal probiotic RepHresh Pro-B  Vaginal washes: Honey Pot Summer's Eve Vagisil Feminine cleanser   Healthy vaginal hygiene practices    -  Avoid sleeper pajamas. Nightgowns allow air to circulate.  Sleep without underpants whenever possible.   -  Wear cotton underpants during the day. Double-rinse underwear after washing to avoid residual irritants. Do not use fabric softeners for underwear and swimsuits.   - Avoid tights, leotards, leggings, "skinny" jeans, and other tight-fitting clothing. Skirts and loose-fitting pants allow air to circulate.   - Avoid pantyliners.  Instead use tampons or cotton pads.   - Use the restroom after intercourse to help prevent UTI's   - Daily warm bathing is helpful:     - Soak in clean water (no soap) for 10 to 15 minutes. Adding vinegar or baking soda to the water has not been specifically studied and may not be better than clean water alone.      -  Use soap to wash regions other than the genital area just before getting out of the tub. Limit use of any soap on genital areas. Use fragance-free soaps.     - Rinse the genital area well and gently pat dry.  Don't rub.  Hair dryer to assist with drying can be used only if on cool setting.     - Do not use bubble baths or perfumed soaps.   - Do not use any feminine sprays, douches or  powders.  These contain chemicals that will irritate the skin.   - If the genital area is tender or swollen, cool compresses may relieve the discomfort. Unscented wet wipes can be used instead of toilet paper for wiping.    - Emollients, such as Vaseline, may help protect skin and can be applied to the irritated area.   - Always remember to wipe front-to-back after bowel movements. Pat dry after urination.   - Do not sit in wet swimsuits for long periods of time after swimming    If you have been instructed to have an in-person evaluation today at a local Urgent Care facility, please use the link below. It will take you to a list of all of our available [facility], including address, phone number and hours of operation. Please do not delay care.  [facility]  If you or a family member do not have a primary care provider, use the link below to schedule a visit and establish care. When you choose a [facility] primary care physician or advanced practice provider, you gain a long-term partner in health. Find a Primary Care Provider  Learn more about [facility]'s in-office and virtual care options: [facility] - Get Care Now

## 2023-01-24 ENCOUNTER — Ambulatory Visit (INDEPENDENT_AMBULATORY_CARE_PROVIDER_SITE_OTHER): Payer: MEDICAID | Admitting: Primary Care

## 2023-03-06 ENCOUNTER — Ambulatory Visit (INDEPENDENT_AMBULATORY_CARE_PROVIDER_SITE_OTHER): Payer: MEDICAID | Admitting: Primary Care

## 2023-03-06 ENCOUNTER — Telehealth (INDEPENDENT_AMBULATORY_CARE_PROVIDER_SITE_OTHER): Payer: Self-pay

## 2023-03-06 NOTE — Telephone Encounter (Signed)
 Contacted pt per provider. Per Marcelino Duster she would like pt to come in person for appt.  Marcelino Duster will be out of the office this morning after 10am.  Reached out to pt to reschedule appt pt didn't answer lvm

## 2023-03-11 ENCOUNTER — Ambulatory Visit (INDEPENDENT_AMBULATORY_CARE_PROVIDER_SITE_OTHER): Payer: MEDICAID | Admitting: Primary Care

## 2023-03-11 ENCOUNTER — Encounter (INDEPENDENT_AMBULATORY_CARE_PROVIDER_SITE_OTHER): Payer: Self-pay | Admitting: Primary Care

## 2023-03-11 VITALS — BP 112/72 | HR 83 | Resp 16 | Ht 60.0 in | Wt 120.2 lb

## 2023-03-11 DIAGNOSIS — N6321 Unspecified lump in the left breast, upper outer quadrant: Secondary | ICD-10-CM | POA: Diagnosis not present

## 2023-03-11 NOTE — Progress Notes (Signed)
 [facility]  Mackenzie Santiago, is a 28 y.o. female  [id number]  [id number]  DOB - [birth year]  Breast mass felt       Subjective:   Mackenzie Santiago is a 28 y.o. female who presents with a concern of feeling a mass on her left breast 2:00 for the last 3 to 4 months.  She is worried her mother sister has breast cancer.  No problems updated.  Comprehensive ROS Pertinent positive and negative noted in HPI   Allergies  Allergen Reactions   Blueberry [Vaccinium Angustifolium] Hives         Past Medical History:  Diagnosis Date   Anxiety    Heart murmur    Hyperthyroidism    Thyroid disease    Trichimoniasis     Current Outpatient Medications on File Prior to Visit  Medication Sig Dispense Refill   CHLOROPHYLL PO Take by mouth. (Patient not taking: Reported on 06/07/2022)     clotrimazole (LOTRIMIN) 1 % cream Apply 1 Application topically 2 (two) times daily. (Patient not taking: Reported on 06/07/2022) 30 g 0   ketoconazole (NIZORAL) 2 % cream Apply 1 application topically daily. (Patient not taking: Reported on 06/07/2022) 30 g 1   MACA ROOT PO Take by mouth.     metroNIDAZOLE (METROGEL) 0.75 % vaginal gel Place 1 Applicatorful vaginally at bedtime. 70 g 0   Multiple Vitamin (MULTIVITAMIN WITH MINERALS) TABS tablet Take 1 tablet by mouth daily.     neomycin-polymyxin-hydrocortisone (CORTISPORIN) OTIC solution Place 3 drops into the right ear 4 (four) times daily. X 5-7 days (Patient not taking: Reported on 06/07/2022) 10 mL 0   ondansetron (ZOFRAN) 4 MG tablet Take 1 tablet (4 mg total) by mouth every 6 (six) hours. (Patient not taking: Reported on 06/07/2022) 12 tablet 0   OVER THE COUNTER MEDICATION Take 1 capsule by mouth daily. Seamoss (Patient not taking: Reported on 06/07/2022)     No current facility-administered medications on file prior to visit.   Health Maintenance  Topic Date Due   COVID-19 Vaccine (1) Never done   Pneumococcal  Vaccination (1 of 2 - PCV) Never done   Flu Shot  04/15/2023*   DTaP/Tdap/Td vaccine (2 - Td or Tdap) 05/15/2024   Pap Smear  06/06/2025   HPV Vaccine  Completed   Hepatitis C Screening  Completed   HIV Screening  Completed  *Topic was postponed. The date shown is not the original due date.    Objective:     Physical Exam Vitals reviewed.  Constitutional:      Appearance: Normal appearance.  HENT:     Head: Normocephalic.     Right Ear: External ear normal.     Left Ear: External ear normal.  Eyes:     Extraocular Movements: Extraocular movements intact.  Musculoskeletal:        General: Normal range of motion.  Skin:    Comments: Breast exam completed also taught patient SBE there is a small palpable nodular upper left corner that is tender  Neurological:     Mental Status: She is alert and oriented to person, place, and time.     Assessment & Plan  Mackenzie Santiago was seen today for cyst.  Diagnoses and all orders for this visit:  Mass of upper outer quadrant of left breast -     MM Digital Diagnostic Bilat; Future    Patient have been counseled extensively about nutrition and exercise. Other issues discussed during this visit include: low cholesterol  diet, weight control and daily exercise, foot care, annual eye examinations at Ophthalmology, importance of adherence with medications and regular follow-up. We also discussed long term complications of uncontrolled diabetes and hypertension.   Return in about 4 weeks (around 04/08/2023) for Anxiety.  The patient was given clear instructions to go to ER or return to medical center if symptoms don't improve, worsen or new problems develop. The patient verbalized understanding. The patient was told to call to get lab results if they haven't heard anything in the next week.   This note has been created with Education officer, environmental. Any transcriptional errors are unintentional.   Grayce Sessions, NP 03/11/2023, 8:25 PM

## 2023-03-18 ENCOUNTER — Encounter (INDEPENDENT_AMBULATORY_CARE_PROVIDER_SITE_OTHER): Payer: Self-pay | Admitting: Primary Care

## 2023-03-18 ENCOUNTER — Ambulatory Visit (INDEPENDENT_AMBULATORY_CARE_PROVIDER_SITE_OTHER): Payer: MEDICAID | Admitting: Primary Care

## 2023-03-18 VITALS — BP 111/75 | HR 70 | Temp 98.0°F | Resp 12 | Ht 61.0 in | Wt 123.2 lb

## 2023-03-18 DIAGNOSIS — K5900 Constipation, unspecified: Secondary | ICD-10-CM

## 2023-03-18 MED ORDER — METAMUCIL SMOOTH TEXTURE 58.6 % PO POWD: 1.0000 | Freq: Three times a day (TID) | ORAL | 12 refills | Status: DC

## 2023-03-18 NOTE — Progress Notes (Signed)
 [facility]  Mackenzie Santiago, is a 28 y.o. female  [id number]  [id number]  DOB - [birth year]  Chief Complaint  Patient presents with   Breast Pain       Subjective:   Mackenzie Santiago is a 28 y.o. female here today for a follow up visit.  She was concerned that last visit breast exam done still having feeling masses in her left breast.  Family history of breast cancer last visit referral made for diagnostic breast exam.  Informed patient they will call her for the exam to schedule.  She is also having increased anxiety with worrying about when this appointment is going to be done.  She has been follow-up by [facility] for anxiety and depression.  Also, has a therapist at [facility].  She will continue to follow-up with them for therapy.  Patient has No headache, No chest pain, No abdominal pain - No Nausea, No new weakness tingling or numbness, No Cough - shortness of breath She also voices excessive flatus and constipation. Constipation This is a recurrent problem. The current episode started more than 1 year ago. The problem has been waxing and waning since onset. Her stool frequency is 2 to 3 times per week. The stool is described as firm and formed (white mocous). The patient is not on a high fiber diet. She Does not exercise regularly. There has Been adequate water intake. Associated symptoms include bloating and flatus. Risk factors include stress. Treatments tried: ginger cloves yogurt. The treatment provided no relief.    No problems updated.  Comprehensive ROS Pertinent positive and negative noted in HPI   Allergies  Allergen Reactions   Blueberry [Vaccinium Angustifolium] Hives         Past Medical History:  Diagnosis Date   Anxiety    Heart murmur    Hyperthyroidism    Thyroid disease    Trichimoniasis     Current Outpatient Medications on File Prior to Visit  Medication Sig Dispense Refill   MACA ROOT PO Take by mouth.     Multiple Vitamin  (MULTIVITAMIN WITH MINERALS) TABS tablet Take 1 tablet by mouth daily.     CHLOROPHYLL PO Take by mouth. (Patient not taking: Reported on 06/07/2022)     clotrimazole (LOTRIMIN) 1 % cream Apply 1 Application topically 2 (two) times daily. (Patient not taking: Reported on 06/07/2022) 30 g 0   ketoconazole (NIZORAL) 2 % cream Apply 1 application topically daily. (Patient not taking: Reported on 06/07/2022) 30 g 1   metroNIDAZOLE (METROGEL) 0.75 % vaginal gel Place 1 Applicatorful vaginally at bedtime. (Patient not taking: Reported on 03/18/2023) 70 g 0   neomycin-polymyxin-hydrocortisone (CORTISPORIN) OTIC solution Place 3 drops into the right ear 4 (four) times daily. X 5-7 days (Patient not taking: Reported on 03/18/2023) 10 mL 0   ondansetron (ZOFRAN) 4 MG tablet Take 1 tablet (4 mg total) by mouth every 6 (six) hours. (Patient not taking: Reported on 03/18/2023) 12 tablet 0   OVER THE COUNTER MEDICATION Take 1 capsule by mouth daily. Seamoss (Patient not taking: Reported on 06/07/2022)     No current facility-administered medications on file prior to visit.   Health Maintenance  Topic Date Due   COVID-19 Vaccine (1) Never done   Pneumococcal Vaccination (1 of 2 - PCV) Never done   Flu Shot  04/15/2023*   DTaP/Tdap/Td vaccine (2 - Td or Tdap) 05/15/2024   Pap Smear  06/06/2025   HPV Vaccine  Completed   Hepatitis C Screening  Completed   HIV Screening  Completed  *Topic was postponed. The date shown is not the original due date.    Objective:   Vitals:   03/18/23 0932  BP: 111/75  Pulse: 70  Resp: 12  Temp: 98 F (36.7 C)  TempSrc: Oral  SpO2: 100%  Weight: 123 lb 3.2 oz (55.9 kg)  Height: 5\' 1"  (1.549 m)     Physical Exam Vitals reviewed.  Constitutional:      Appearance: Normal appearance.  HENT:     Head: Normocephalic.     Right Ear: Tympanic membrane, ear canal and external ear normal.     Left Ear: Tympanic membrane, ear canal and external ear normal.     Nose: Nose  normal.     Mouth/Throat:     Mouth: Mucous membranes are moist.  Eyes:     Extraocular Movements: Extraocular movements intact.     Pupils: Pupils are equal, round, and reactive to light.  Cardiovascular:     Rate and Rhythm: Normal rate.  Pulmonary:     Effort: Pulmonary effort is normal.     Breath sounds: Normal breath sounds.  Abdominal:     General: Bowel sounds are normal.     Palpations: Abdomen is soft.  Musculoskeletal:        General: Normal range of motion.     Cervical back: Normal range of motion.  Skin:    General: Skin is warm and dry.  Neurological:     Mental Status: She is alert and oriented to person, place, and time.  Psychiatric:        Mood and Affect: Mood normal.        Behavior: Behavior normal.        Thought Content: Thought content normal.       Assessment & Plan  Mackenzie Santiago was seen today for breast pain.  Diagnoses and all orders for this visit:  Constipation, unspecified constipation type -     psyllium (METAMUCIL SMOOTH TEXTURE) 58.6 % powder; Take 1 packet by mouth 3 (three) times daily.     Patient have been counseled extensively about nutrition and exercise. Other issues discussed during this visit include: low cholesterol diet, weight control and daily exercise, foot care, annual eye examinations at Ophthalmology, importance of adherence with medications and regular follow-up. We also discussed long term complications of uncontrolled diabetes and hypertension.   Return in about 3 months (around 06/18/2023).  The patient was given clear instructions to go to ER or return to medical center if symptoms don't improve, worsen or new problems develop. The patient verbalized understanding. The patient was told to call to get lab results if they haven't heard anything in the next week.   This note has been created with Education officer, environmental. Any transcriptional errors are unintentional.   Grayce Sessions, NP 03/18/2023, 9:58 AM

## 2023-03-18 NOTE — Progress Notes (Signed)
 Pt is here for multiple complaints   Complaining of left breast pain/possible mass, excessive gas and increased anxiety

## 2023-03-18 NOTE — Patient Instructions (Signed)

## 2023-04-17 ENCOUNTER — Other Ambulatory Visit: Payer: MEDICAID

## 2023-07-30 ENCOUNTER — Encounter (INDEPENDENT_AMBULATORY_CARE_PROVIDER_SITE_OTHER): Payer: MEDICAID | Admitting: Primary Care

## 2023-08-02 ENCOUNTER — Telehealth: Payer: MEDICAID | Admitting: Physician Assistant

## 2023-08-02 DIAGNOSIS — N760 Acute vaginitis: Secondary | ICD-10-CM

## 2023-08-02 MED ORDER — ONDANSETRON 4 MG PO TBDP
4.0000 mg | ORAL_TABLET | Freq: Three times a day (TID) | ORAL | 0 refills | Status: AC | PRN
Start: 2023-08-02 — End: 2023-08-09

## 2023-08-02 MED ORDER — METRONIDAZOLE 500 MG PO TABS: 500.0000 mg | ORAL_TABLET | Freq: Two times a day (BID) | ORAL | 0 refills | Status: AC

## 2023-08-02 NOTE — Patient Instructions (Signed)
  [employer], thank you for joining [name], PA-C for today's virtual visit.  While this provider is not your primary care provider (PCP), if your PCP is located in our provider database this encounter information will be shared with them immediately following your visit.   A [facility] MyChart account gives you access to today's visit and all your visits, tests, and labs performed at [facility]  click here if you don't have a [facility] MyChart account or go to [email or web address]  Consent: (Patient) Mackenzie Santiago provided verbal consent for this virtual visit at the beginning of the encounter.  Current Medications:  Current Outpatient Medications:    CHLOROPHYLL PO, Take by mouth. (Patient not taking: Reported on 06/07/2022), Disp: , Rfl:    clotrimazole  (LOTRIMIN ) 1 % cream, Apply 1 Application topically 2 (two) times daily. (Patient not taking: Reported on 06/07/2022), Disp: 30 g, Rfl: 0   ketoconazole  (NIZORAL ) 2 % cream, Apply 1 application topically daily. (Patient not taking: Reported on 06/07/2022), Disp: 30 g, Rfl: 1   MACA ROOT PO, Take by mouth., Disp: , Rfl:    metroNIDAZOLE  (METROGEL ) 0.75 % vaginal gel, Place 1 Applicatorful vaginally at bedtime. (Patient not taking: Reported on 03/18/2023), Disp: 70 g, Rfl: 0   Multiple Vitamin (MULTIVITAMIN WITH MINERALS) TABS tablet, Take 1 tablet by mouth daily., Disp: , Rfl:    neomycin -polymyxin-hydrocortisone (CORTISPORIN) OTIC solution, Place 3 drops into the right ear 4 (four) times daily. X 5-7 days (Patient not taking: Reported on 03/18/2023), Disp: 10 mL, Rfl: 0   ondansetron  (ZOFRAN ) 4 MG tablet, Take 1 tablet (4 mg total) by mouth every 6 (six) hours. (Patient not taking: Reported on 03/18/2023), Disp: 12 tablet, Rfl: 0   OVER THE COUNTER MEDICATION, Take 1 capsule by mouth daily. Seamoss (Patient not taking: Reported on 06/07/2022), Disp: , Rfl:    psyllium (METAMUCIL SMOOTH TEXTURE) 58.6 % powder, Take  1 packet by mouth 3 (three) times daily., Disp: 283 g, Rfl: 12   Medications ordered in this encounter:  No orders of the defined types were placed in this encounter.    *If you need refills on other medications prior to your next appointment, please contact your pharmacy*  Follow-Up: Call back or seek an in-person evaluation if the symptoms worsen or if the condition fails to improve as anticipated.  [facility] [phone]  Other Instructions Please report to the nearest Emergency room with any worsening symptoms. Follow up with primary care provider (PCP) in 2 -3 days.    If you have been instructed to have an in-person evaluation today at a local Urgent Care facility, please use the link below. It will take you to a list of all of our available [facility], including address, phone number and hours of operation. Please do not delay care.  [facility]  If you or a family member do not have a primary care provider, use the link below to schedule a visit and establish care. When you choose a [facility] primary care physician or advanced practice provider, you gain a long-term partner in health. Find a Primary Care Provider  Learn more about [facility]'s in-office and virtual care options: [facility] - Get Care Now

## 2023-08-02 NOTE — Progress Notes (Signed)
 Virtual Visit Consent   Mackenzie Santiago, you are scheduled for a virtual visit with a [facility] provider today. Just as with appointments in the office, your consent must be obtained to participate. Your consent will be active for this visit and any virtual visit you may have with one of our providers in the next 365 days. If you have a MyChart account, a copy of this consent can be sent to you electronically.  As this is a virtual visit, video technology does not allow for your provider to perform a traditional examination. This may limit your provider's ability to fully assess your condition. If your provider identifies any concerns that need to be evaluated in person or the need to arrange testing (such as labs, EKG, etc.), we will make arrangements to do so. Although advances in technology are sophisticated, we cannot ensure that it will always work on either your end or our end. If the connection with a video visit is poor, the visit may have to be switched to a telephone visit. With either a video or telephone visit, we are not always able to ensure that we have a secure connection.  By engaging in this virtual visit, you consent to the provision of healthcare and authorize for your insurance to be billed (if applicable) for the services provided during this visit. Depending on your insurance coverage, you may receive a charge related to this service.  I need to obtain your verbal consent now. Are you willing to proceed with your visit today? Mackenzie Santiago has provided verbal consent on 08/02/2023 for a virtual visit (video or telephone). Mackenzie Santiago, [place]  Date: 08/02/2023 8:06 AM   Virtual Visit via Video Note   I, Mackenzie Santiago, connected with  Mackenzie Santiago  ([id number], 27-Jul-1995) on 08/02/23 at  8:00 AM EDT by a video-enabled telemedicine application and verified that I am speaking with the correct person using two identifiers.  Location: Patient: Virtual Visit Location  Patient: Home Provider: Virtual Visit Location Provider: Home Office   I discussed the limitations of evaluation and management by telemedicine and the availability of in person appointments. The patient expressed understanding and agreed to proceed.    History of Present Illness: Mackenzie Santiago is a 28 y.o. who identifies as a female who was assigned female at birth, and is being seen today for suspected BV.Mackenzie Santiago  HPI: Vaginal Discharge The patient's primary symptoms include a genital odor and vaginal discharge. This is a new problem. The current episode started in the past 7 days. The problem occurs constantly. The patient is experiencing no pain. Pertinent negatives include no dysuria, rash or urgency. There has been no bleeding. She has not been passing clots. She has not been passing tissue. Nothing aggravates the symptoms. No, her partner does not have an STD. She uses nothing for contraception. Her menstrual history has been irregular. Her past medical history is significant for vaginosis.    Problems:  Patient Active Problem List   Diagnosis Date Noted   Hyperthyroidism 06/26/2020   Obstetric labial laceration, delivered, current hospitalization 11/23/2015   H/O anxiety state 11/22/2015   H/O sickle cell trait (FOB neg) 11/22/2015   H/O: depression 11/21/2015   Alcohol abuse 03/23/2014   Smoker 03/23/2014   Thyroid  dysfunction 05/07/2011   Tonsillar hypertrophy 05/07/2011   Abnormal weight loss 02/19/2011    Allergies:  Allergies  Allergen Reactions   Blueberry [Vaccinium Angustifolium] Hives        Medications:  Current Outpatient Medications:  CHLOROPHYLL PO, Take by mouth. (Patient not taking: Reported on 06/07/2022), Disp: , Rfl:    clotrimazole  (LOTRIMIN ) 1 % cream, Apply 1 Application topically 2 (two) times daily. (Patient not taking: Reported on 06/07/2022), Disp: 30 g, Rfl: 0   ketoconazole  (NIZORAL ) 2 % cream, Apply 1 application topically daily. (Patient not  taking: Reported on 06/07/2022), Disp: 30 g, Rfl: 1   MACA ROOT PO, Take by mouth., Disp: , Rfl:    metroNIDAZOLE  (METROGEL ) 0.75 % vaginal gel, Place 1 Applicatorful vaginally at bedtime. (Patient not taking: Reported on 03/18/2023), Disp: 70 g, Rfl: 0   Multiple Vitamin (MULTIVITAMIN WITH MINERALS) TABS tablet, Take 1 tablet by mouth daily., Disp: , Rfl:    neomycin -polymyxin-hydrocortisone (CORTISPORIN) OTIC solution, Place 3 drops into the right ear 4 (four) times daily. X 5-7 days (Patient not taking: Reported on 03/18/2023), Disp: 10 mL, Rfl: 0   ondansetron  (ZOFRAN ) 4 MG tablet, Take 1 tablet (4 mg total) by mouth every 6 (six) hours. (Patient not taking: Reported on 03/18/2023), Disp: 12 tablet, Rfl: 0   OVER THE COUNTER MEDICATION, Take 1 capsule by mouth daily. Seamoss (Patient not taking: Reported on 06/07/2022), Disp: , Rfl:    psyllium (METAMUCIL SMOOTH TEXTURE) 58.6 % powder, Take 1 packet by mouth 3 (three) times daily., Disp: 283 g, Rfl: 12  Observations/Objective: Patient is well-developed, well-nourished in no acute distress.  Resting comfortably  at home.  Head is normocephalic, atraumatic.  No labored breathing.  Speech is clear and coherent with logical content.  Patient is alert and oriented at baseline.    Assessment and Plan: 1. Vaginosis (Primary)   Patient presenting with vaginal odor and discharge most consistent with BV.  I also considered PID, pregnancy, ectopic pregnancy, UTI, endometriosis, tubovarian abscess, appendicitis, yeast vagnitis,  and pyelonephritis, but this appears less likely considering the data gathered thus far.  Medication prescribed. I have instructed the patient to present to the nearest ER at any time if there are any new or worsening symptoms.  The patient expressed understanding of and agreement with this plan.  Opportunity was given for questions prior to discharge and all stated questions were answered to the patient's satisfaction.   Follow Up  Instructions: I discussed the assessment and treatment plan with the patient. The patient was provided an opportunity to ask questions and all were answered. The patient agreed with the plan and demonstrated an understanding of the instructions.  A copy of instructions were sent to the patient via MyChart unless otherwise noted below.    The patient was advised to call back or seek an in-person evaluation if the symptoms worsen or if the condition fails to improve as anticipated.    Mackenzie Shuck, PA-C

## 2023-09-20 ENCOUNTER — Telehealth: Payer: MEDICAID

## 2023-09-20 DIAGNOSIS — B9689 Other specified bacterial agents as the cause of diseases classified elsewhere: Secondary | ICD-10-CM | POA: Diagnosis not present

## 2023-09-20 DIAGNOSIS — N760 Acute vaginitis: Secondary | ICD-10-CM

## 2023-09-20 DIAGNOSIS — Z3201 Encounter for pregnancy test, result positive: Secondary | ICD-10-CM | POA: Diagnosis not present

## 2023-09-20 MED ORDER — METRONIDAZOLE 0.75 % VA GEL: 1.0000 | Freq: Every day | VAGINAL | 0 refills | Status: DC

## 2023-09-20 NOTE — Progress Notes (Signed)
 Virtual Visit Consent   Mackenzie Santiago, you are scheduled for a virtual visit with a [facility] provider today. Just as with appointments in the office, your consent must be obtained to participate. Your consent will be active for this visit and any virtual visit you may have with one of our providers in the next 365 days. If you have a MyChart account, a copy of this consent can be sent to you electronically.  As this is a virtual visit, video technology does not allow for your provider to perform a traditional examination. This may limit your provider's ability to fully assess your condition. If your provider identifies any concerns that need to be evaluated in person or the need to arrange testing (such as labs, EKG, etc.), we will make arrangements to do so. Although advances in technology are sophisticated, we cannot ensure that it will always work on either your end or our end. If the connection with a video visit is poor, the visit may have to be switched to a telephone visit. With either a video or telephone visit, we are not always able to ensure that we have a secure connection.  By engaging in this virtual visit, you consent to the provision of healthcare and authorize for your insurance to be billed (if applicable) for the services provided during this visit. Depending on your insurance coverage, you may receive a charge related to this service.  I need to obtain your verbal consent now. Are you willing to proceed with your visit today? Eppie Myung has provided verbal consent on 09/20/2023 for a virtual visit (video or telephone). [name], PA-C  Date: 09/20/2023 8:12 AM   Virtual Visit via Video Note   I, [name], connected with  Indiya Izquierdo  ([id number], 1995-08-26) on 09/20/23 at  8:00 AM EDT by a video-enabled telemedicine application and verified that I am speaking with the correct person using two identifiers.  Location: Patient: Virtual Visit  Location Patient: Home Provider: Virtual Visit Location Provider: Home Office   I discussed the limitations of evaluation and management by telemedicine and the availability of in person appointments. The patient expressed understanding and agreed to proceed.    History of Present Illness: Mackenzie Santiago is a 28 y.o. who identifies as a female who was assigned female at birth, and is being seen today for BV.  HPI: Vaginal Discharge The patient's primary symptoms include a genital odor, missed menses (12 days) and vaginal discharge. The patient's pertinent negatives include no genital itching or genital lesions. This is a new problem. The current episode started in the past 7 days. The problem occurs constantly. The problem has been unchanged. The patient is experiencing no pain. She is pregnant (2 at home test have been positive and she is 12 days late; Has OB appt on 10/10/23). Pertinent negatives include no chills, dysuria, fever, flank pain, frequency or nausea. The vaginal discharge was white, thin, malodorous and milky. There has been no bleeding. She has not been passing clots. She has not been passing tissue. Nothing aggravates the symptoms. She has tried nothing for the symptoms. The treatment provided no relief.     Problems:  Patient Active Problem List   Diagnosis Date Noted   Hyperthyroidism 06/26/2020   Obstetric labial laceration, delivered, current hospitalization 11/23/2015   H/O anxiety state 11/22/2015   H/O sickle cell trait (FOB neg) 11/22/2015   H/O: depression 11/21/2015   Alcohol abuse 03/23/2014   Smoker 03/23/2014   Thyroid   dysfunction 05/07/2011   Tonsillar hypertrophy 05/07/2011   Abnormal weight loss 02/19/2011    Allergies:  Allergies  Allergen Reactions   Blueberry [Vaccinium Angustifolium] Hives        Medications:  Current Outpatient Medications:    metroNIDAZOLE  (METROGEL ) 0.75 % vaginal gel, Place 1 Applicatorful vaginally at bedtime for 7  days., Disp: 70 g, Rfl: 0   CHLOROPHYLL PO, Take by mouth. (Patient not taking: Reported on 06/07/2022), Disp: , Rfl:    clotrimazole  (LOTRIMIN ) 1 % cream, Apply 1 Application topically 2 (two) times daily. (Patient not taking: Reported on 06/07/2022), Disp: 30 g, Rfl: 0   ketoconazole  (NIZORAL ) 2 % cream, Apply 1 application topically daily. (Patient not taking: Reported on 06/07/2022), Disp: 30 g, Rfl: 1   MACA ROOT PO, Take by mouth., Disp: , Rfl:    Multiple Vitamin (MULTIVITAMIN WITH MINERALS) TABS tablet, Take 1 tablet by mouth daily., Disp: , Rfl:    neomycin -polymyxin-hydrocortisone (CORTISPORIN) OTIC solution, Place 3 drops into the right ear 4 (four) times daily. X 5-7 days (Patient not taking: Reported on 03/18/2023), Disp: 10 mL, Rfl: 0   ondansetron  (ZOFRAN ) 4 MG tablet, Take 1 tablet (4 mg total) by mouth every 6 (six) hours. (Patient not taking: Reported on 03/18/2023), Disp: 12 tablet, Rfl: 0   OVER THE COUNTER MEDICATION, Take 1 capsule by mouth daily. Seamoss (Patient not taking: Reported on 06/07/2022), Disp: , Rfl:    psyllium (METAMUCIL SMOOTH TEXTURE) 58.6 % powder, Take 1 packet by mouth 3 (three) times daily., Disp: 283 g, Rfl: 12  Observations/Objective: Patient is well-developed, well-nourished in no acute distress.  Resting comfortably at home.  Head is normocephalic, atraumatic.  No labored breathing.  Speech is clear and coherent with logical content.  Patient is alert and oriented at baseline.    Assessment and Plan: 1. BV (bacterial vaginosis) (Primary) - metroNIDAZOLE  (METROGEL ) 0.75 % vaginal gel; Place 1 Applicatorful vaginally at bedtime for 7 days.  Dispense: 70 g; Refill: 0  2. Positive pregnancy test  - Symptoms consistent with BV - Metrogel  prescribed - Limit bubble baths, scented lotions/soaps/detergents - Limit tight fitting clothing - Seek on person evaluation if not improving or if symptoms worsen   Follow Up Instructions: I discussed the  assessment and treatment plan with the patient. The patient was provided an opportunity to ask questions and all were answered. The patient agreed with the plan and demonstrated an understanding of the instructions.  A copy of instructions were sent to the patient via MyChart unless otherwise noted below.    The patient was advised to call back or seek an in-person evaluation if the symptoms worsen or if the condition fails to improve as anticipated.    [name], PA-C

## 2023-09-20 NOTE — Patient Instructions (Signed)
 [employer], thank you for joining Mackenzie CHRISTELLA Dickinson, PA-C for today's virtual visit.  While this provider is not your primary care provider (PCP), if your PCP is located in our provider database this encounter information will be shared with them immediately following your visit.   A [facility] MyChart account gives you access to today's visit and all your visits, tests, and labs performed at [facility]  click here if you don't have a [facility] MyChart account or go to [email or web address]  Consent: (Patient) Mackenzie Santiago provided verbal consent for this virtual visit at the beginning of the encounter.  Current Medications:  Current Outpatient Medications:    metroNIDAZOLE  (METROGEL ) 0.75 % vaginal gel, Place 1 Applicatorful vaginally at bedtime for 7 days., Disp: 70 g, Rfl: 0   CHLOROPHYLL PO, Take by mouth. (Patient not taking: Reported on 06/07/2022), Disp: , Rfl:    clotrimazole  (LOTRIMIN ) 1 % cream, Apply 1 Application topically 2 (two) times daily. (Patient not taking: Reported on 06/07/2022), Disp: 30 g, Rfl: 0   ketoconazole  (NIZORAL ) 2 % cream, Apply 1 application topically daily. (Patient not taking: Reported on 06/07/2022), Disp: 30 g, Rfl: 1   MACA ROOT PO, Take by mouth., Disp: , Rfl:    Multiple Vitamin (MULTIVITAMIN WITH MINERALS) TABS tablet, Take 1 tablet by mouth daily., Disp: , Rfl:    neomycin -polymyxin-hydrocortisone (CORTISPORIN) OTIC solution, Place 3 drops into the right ear 4 (four) times daily. X 5-7 days (Patient not taking: Reported on 03/18/2023), Disp: 10 mL, Rfl: 0   ondansetron  (ZOFRAN ) 4 MG tablet, Take 1 tablet (4 mg total) by mouth every 6 (six) hours. (Patient not taking: Reported on 03/18/2023), Disp: 12 tablet, Rfl: 0   OVER THE COUNTER MEDICATION, Take 1 capsule by mouth daily. Seamoss (Patient not taking: Reported on 06/07/2022), Disp: , Rfl:    psyllium (METAMUCIL SMOOTH TEXTURE) 58.6 % powder, Take 1 packet by mouth 3  (three) times daily., Disp: 283 g, Rfl: 12   Medications ordered in this encounter:  Meds ordered this encounter  Medications   metroNIDAZOLE  (METROGEL ) 0.75 % vaginal gel    Sig: Place 1 Applicatorful vaginally at bedtime for 7 days.    Dispense:  70 g    Refill:  0    Supervising Provider:   BLAISE ALEENE KIDD [[id number]]     *If you need refills on other medications prior to your next appointment, please contact your pharmacy*  Follow-Up: Call back or seek an in-person evaluation if the symptoms worsen or if the condition fails to improve as anticipated.  [facility] [phone]  Other Instructions Vaginal Infection (Bacterial Vaginosis): What to Know  Bacterial vaginosis is an infection of the vagina. It happens when the balance of normal germs (bacteria) in the vagina changes. It's common among females ages 68 to 21. If left untreated, it can increase your risk of getting a sexually transmitted infection (STI). If you're pregnant, you need to get treated right away. This infection can cause a baby to be born early or at a low birth weight. What are the causes? This happens when too many harmful germs grow in the vagina. The exact reason why this happens isn't known. You can't get this infection from toilet seats, bedding, swimming pools, or contact with objects around you. What increases the risk? Having new or multiple sexual partners, or unprotected sex. Douching. Using an intrauterine device (IUD). Smoking. Alcohol and drug abuse. Taking certain antibiotics. Being pregnant. You can get  a vaginal infection without being sexually active. However, it most often occurs in sexually active females. What are the signs or symptoms? Some females have no symptoms. If you have symptoms, they may include: [place] or white vaginal discharge. It can be watery or foamy. A fish-like smell, especially after sex or during your menstrual period. Itching in and around the  vagina. Burning or pain with peeing. How is this diagnosed? This infection is diagnosed based on: Your medical history. A physical exam of the vagina. Checking a sample of vaginal fluid for harmful bacteria or uncommon cells. How is this treated? This condition is treated with antibiotics. These may be given as: A pill. A cream for your vagina. A medicine that you put into your vagina called a suppository. If the infection comes back, you may need more antibiotics. Follow these instructions at home: Medicines Take your medicines only as told. Take or apply your antibiotics as told. Do not stop using them even if you start to feel better. General instructions If you have a female sexual partner, tell her about the infection. She should see her health care provider. Female partners don't need treatment. Avoid sex until treatment is complete. Drink more fluids as told. Keep the area around your vagina and rectum clean. Wash the area daily with warm water. Wipe yourself from front to back after pooping. If you're breastfeeding, talk to your provider about continuing during treatment. How is this prevented? Self-care Do not douche or use vaginal deodorant sprays. Douching can upset the balance of good and harmful bacteria in the vagina, which can cause an infection to happen again. Wear cotton or cotton-lined underwear. Avoid wearing tight pants or pantyhose, especially in the summer. Safe sex Use condoms correctly and every time you have sex. Use dental dams to protect yourself during oral sex. Limit the number of sexual partners. Get tested for STIs. Your sexual partner should also get tested. Drugs and alcohol Do not smoke, vape, or use nicotine or tobacco. Do not use drugs. Limit the amount of alcohol you drink because it can lead to risky sexual behavior. Where to find more information To learn more: Go to TonerPromos.no. Click Health Topics A-Z. Type bacterial vaginosis in the  search box. American Sexual Health Association (ASHA): ashasexualhealth.org U.S. Department of Health and Health and safety inspector, Office on Women's Health: TravelLesson.ca Contact a health care provider if: Your symptoms don't get better, even after treatment. You have more discharge or pain when peeing. You have a fever or chills. You have pain in your belly or pelvis. You have pain during sex. You have vaginal bleeding between menstrual periods. This information is not intended to replace advice given to you by your health care provider. Make sure you discuss any questions you have with your health care provider. Document Revised: 06/20/2022 Document Reviewed: 06/20/2022 Elsevier Patient Education  2024 Elsevier Inc.   If you have been instructed to have an in-person evaluation today at a local Urgent Care facility, please use the link below. It will take you to a list of all of our available [facility], including address, phone number and hours of operation. Please do not delay care.  [facility]  If you or a family member do not have a primary care provider, use the link below to schedule a visit and establish care. When you choose a [facility] primary care physician or advanced practice provider, you gain a long-term partner in health. Find a Primary Care  Provider  Learn more about [facility]'s in-office and virtual care options: [facility] - Get Care Now

## 2023-09-21 ENCOUNTER — Inpatient Hospital Stay (HOSPITAL_COMMUNITY)
Admission: AD | Admit: 2023-09-21 | Discharge: 2023-09-22 | Disposition: A | Discharge status: Home / Self Care | Payer: MEDICAID | Attending: Obstetrics and Gynecology | Admitting: Obstetrics and Gynecology

## 2023-09-21 ENCOUNTER — Encounter (HOSPITAL_COMMUNITY): Payer: Self-pay | Admitting: *Deleted

## 2023-09-21 ENCOUNTER — Inpatient Hospital Stay (HOSPITAL_COMMUNITY): Payer: MEDICAID

## 2023-09-21 DIAGNOSIS — O4690 Antepartum hemorrhage, unspecified, unspecified trimester: Secondary | ICD-10-CM

## 2023-09-21 DIAGNOSIS — O209 Hemorrhage in early pregnancy, unspecified: Secondary | ICD-10-CM | POA: Diagnosis present

## 2023-09-21 DIAGNOSIS — Z87891 Personal history of nicotine dependence: Secondary | ICD-10-CM | POA: Insufficient documentation

## 2023-09-21 DIAGNOSIS — O3680X0 Pregnancy with inconclusive fetal viability, not applicable or unspecified: Secondary | ICD-10-CM | POA: Diagnosis not present

## 2023-09-21 DIAGNOSIS — Z3A01 Less than 8 weeks gestation of pregnancy: Secondary | ICD-10-CM | POA: Insufficient documentation

## 2023-09-21 LAB — COMPREHENSIVE METABOLIC PANEL WITH GFR
ALT: 15 U/L (ref 0–44)
AST: 21 U/L (ref 15–41)
Albumin: 3.7 g/dL (ref 3.5–5.0)
Alkaline Phosphatase: 50 U/L (ref 38–126)
Anion gap: 12 (ref 5–15)
BUN: 8 mg/dL (ref 6–20)
CO2: 21 mmol/L — ABNORMAL LOW (ref 22–32)
Calcium: 9 mg/dL (ref 8.9–10.3)
Chloride: 104 mmol/L (ref 98–111)
Creatinine, Ser: 0.54 mg/dL (ref 0.44–1.00)
GFR, Estimated: >60 mL/min (ref >60)
Glucose, Bld: 92 mg/dL (ref 70–99)
Potassium: 3.5 mmol/L (ref 3.5–5.1)
Sodium: 137 mmol/L (ref 135–145)
Total Bilirubin: 0.7 mg/dL (ref 0.0–1.2)
Total Protein: 6.9 g/dL (ref 6.5–8.1)

## 2023-09-21 LAB — URINALYSIS, ROUTINE W REFLEX MICROSCOPIC
Bilirubin Urine: NEGATIVE
Glucose, UA: NEGATIVE mg/dL
Ketones, ur: NEGATIVE mg/dL
Leukocytes,Ua: NEGATIVE
Nitrite: NEGATIVE
Protein, ur: NEGATIVE mg/dL
RBC / HPF: >50 RBC/hpf (ref 0–5)
Specific Gravity, Urine: 1.023 (ref 1.005–1.030)
pH: 6 (ref 5.0–8.0)

## 2023-09-21 LAB — CBC
HCT: 35.2 % — ABNORMAL LOW (ref 36.0–46.0)
Hemoglobin: 11.9 g/dL — ABNORMAL LOW (ref 12.0–15.0)
MCH: 24.5 pg — ABNORMAL LOW (ref 26.0–34.0)
MCHC: 33.8 g/dL (ref 30.0–36.0)
MCV: 72.4 fL — ABNORMAL LOW (ref 80.0–100.0)
Platelets: 239 K/uL (ref 150–400)
RBC: 4.86 MIL/uL (ref 3.87–5.11)
RDW: 14.6 % (ref 11.5–15.5)
WBC: 8 K/uL (ref 4.0–10.5)
nRBC: 0 % (ref 0.0–0.2)

## 2023-09-21 LAB — POCT PREGNANCY, URINE: Preg Test, Ur: POSITIVE — AB

## 2023-09-21 LAB — HCG, QUANTITATIVE, PREGNANCY: hCG, Beta Chain, Quant, S: 152 m[IU]/mL — ABNORMAL HIGH (ref <5)

## 2023-09-21 LAB — ABO/RH: ABO/RH(D): O POS

## 2023-09-21 MED ORDER — ACETAMINOPHEN 325 MG PO TABS
650.0000 mg | ORAL_TABLET | Freq: Once | ORAL | Status: AC
  Administered 2023-09-22: 650 mg via ORAL
  Filled 2023-09-21: qty 2

## 2023-09-21 NOTE — MAU Provider Note (Signed)
 History     CSN: [id number]  Arrival date and time: 09/21/23 2219   Event Date/Time   First Provider Initiated Contact with Patient 09/21/23 2259      Chief Complaint  Patient presents with   Vaginal Bleeding    Mackenzie Santiago is a 28 y.o. G3P1011 at [redacted]w[redacted]d who has not started prenatal care.  She presents today for vaginal bleeding and pressure.  She states the pressure started during sexual activity yesterday. She reports spotting with wiping that was light in color and amount. She denies clots.   She reports her period was 10 days late and she took a home UPT ~ 3 days ago that was positive. She reports she contacted her primary care provider with c/o vaginal odor and was given prescription for Metrogel  which she started yesterday.   She rates the discomfort a 3-4/10 currently, but it was a 6/10.   OB History     Gravida  3   Para  1   Term  1   Preterm      AB  1   Living  1      SAB      IAB      Ectopic      Multiple  0   Live Births  1           Past Medical History:  Diagnosis Date   Anxiety    Heart murmur    Hyperthyroidism    Thyroid  disease    Trichimoniasis     Past Surgical History:  Procedure Laterality Date   NO PAST SURGERIES      Family History  Problem Relation Age of Onset   Hypertension Mother    Sickle cell anemia Father    Cancer Paternal Aunt    Sickle cell anemia Maternal Grandfather    Thyroid  disease Paternal Grandmother    Sickle cell anemia Paternal Grandfather    Alcohol abuse Neg Hx    Arthritis Neg Hx    Asthma Neg Hx    Birth defects Neg Hx    COPD Neg Hx    Depression Neg Hx    Diabetes Neg Hx    Drug abuse Neg Hx    Early death Neg Hx    Hearing loss Neg Hx    Heart disease Neg Hx    Hyperlipidemia Neg Hx    Kidney disease Neg Hx    Learning disabilities Neg Hx    Mental illness Neg Hx    Mental retardation Neg Hx    Miscarriages / Stillbirths Neg Hx    Stroke Neg Hx    Vision loss Neg Hx     Varicose Veins Neg Hx     Social History   Tobacco Use   Smoking status: Former    Current packs/day: 0.25    Types: Cigarettes   Smokeless tobacco: Current  Vaping Use   Vaping status: Every Day   Substances: Nicotine  Substance Use Topics   Alcohol use: Yes    Comment: social   Drug use: No    Allergies:  Allergies  Allergen Reactions   Blueberry [Vaccinium Angustifolium] Hives         Medications Prior to Admission  Medication Sig Dispense Refill Last Dose/Taking   CHLOROPHYLL PO Take by mouth. (Patient not taking: Reported on 06/07/2022)      clotrimazole  (LOTRIMIN ) 1 % cream Apply 1 Application topically 2 (two) times daily. (Patient not taking: Reported on 06/07/2022) 30 g  0    ketoconazole  (NIZORAL ) 2 % cream Apply 1 application topically daily. (Patient not taking: Reported on 06/07/2022) 30 g 1    MACA ROOT PO Take by mouth.      metroNIDAZOLE  (METROGEL ) 0.75 % vaginal gel Place 1 Applicatorful vaginally at bedtime for 7 days. 70 g 0    Multiple Vitamin (MULTIVITAMIN WITH MINERALS) TABS tablet Take 1 tablet by mouth daily.      neomycin -polymyxin-hydrocortisone (CORTISPORIN) OTIC solution Place 3 drops into the right ear 4 (four) times daily. X 5-7 days (Patient not taking: Reported on 03/18/2023) 10 mL 0    ondansetron  (ZOFRAN ) 4 MG tablet Take 1 tablet (4 mg total) by mouth every 6 (six) hours. (Patient not taking: Reported on 03/18/2023) 12 tablet 0    OVER THE COUNTER MEDICATION Take 1 capsule by mouth daily. Seamoss (Patient not taking: Reported on 06/07/2022)      psyllium (METAMUCIL SMOOTH TEXTURE) 58.6 % powder Take 1 packet by mouth 3 (three) times daily. 283 g 12     Review of Systems  Gastrointestinal:  Negative for constipation, diarrhea, nausea and vomiting.  Genitourinary:  Positive for dyspareunia (Pressure), vaginal bleeding and vaginal discharge. Negative for difficulty urinating and dysuria.   Physical Exam   Blood pressure 112/60, pulse 82,  temperature 98.3 F (36.8 C), resp. rate 18, height 5' 2 (1.575 m), weight 55.2 kg, last menstrual period 08/15/2023.  Physical Exam Vitals reviewed.  Constitutional:      Appearance: Normal appearance.  HENT:     Head: Normocephalic and atraumatic.  Eyes:     Conjunctiva/sclera: Conjunctivae normal.  Cardiovascular:     Rate and Rhythm: Normal rate.  Pulmonary:     Effort: Pulmonary effort is normal.  Musculoskeletal:        General: Normal range of motion.     Cervical back: Normal range of motion.  Skin:    General: Skin is warm and dry.  Neurological:     Mental Status: She is alert and oriented to person, place, and time.  Psychiatric:        Mood and Affect: Mood normal.        Behavior: Behavior normal.     MAU Course  Procedures Results for orders placed or performed during the hospital encounter of 09/21/23 (from the past 24 hours)  Pregnancy, urine POC     Status: Abnormal   Collection Time: 09/21/23 10:36 PM  Result Value Ref Range   Preg Test, Ur POSITIVE (A) NEGATIVE    MDM Physical Exam Cultures: Wet Prep and GC/CT Labs: UA, UPT, CBC, CMP, hCG, ABO Ultrasound Assessment and Plan  28 year old G3P1011 at 5.2 weeks ***  -Reviewed POC with patient. -Exam performed and findings discussed.  -*** -*** -***   Mackenzie Santiago 09/21/2023, 10:59 PM

## 2023-09-21 NOTE — MAU Note (Signed)
 Mackenzie Santiago is a 28 y.o. at Unknown here in MAU reporting: Started spotting today. Reports feeling some lower abd discomfort for a few days. Had intercourse last night and stated it was uncomfortable.  LMP: 08/15/2023 Onset of complaint: tonight Pain score: 6 Vitals:   09/21/23 2249  BP: 112/60  Pulse: 82  Resp: 18  Temp: 98.3 F (36.8 C)     FHT: n/a  Lab orders placed from triage: UPT, U/A, Wet prep, Gc

## 2023-09-22 DIAGNOSIS — Z3A01 Less than 8 weeks gestation of pregnancy: Secondary | ICD-10-CM

## 2023-09-22 DIAGNOSIS — O3680X0 Pregnancy with inconclusive fetal viability, not applicable or unspecified: Secondary | ICD-10-CM

## 2023-09-22 NOTE — MAU Provider Note (Incomplete)
 History     CSN: [id number]  Arrival date and time: 09/21/23 2219   Event Date/Time   First Provider Initiated Contact with Patient 09/21/23 2259      Chief Complaint  Patient presents with  . Vaginal Bleeding    Mackenzie Santiago is a 28 y.o. G3P1011 at [redacted]w[redacted]d who has not started prenatal care.  She presents today for vaginal bleeding and pressure.  She states the pressure started during sexual activity yesterday. She reports spotting with wiping that was light in color and amount. She denies clots.   She reports her period was 10 days late and she took a home UPT ~ 3 days ago that was positive. She reports she contacted her primary care provider with c/o vaginal odor and was given prescription for Metrogel  which she started yesterday.   She rates the discomfort a 3-4/10 currently, but it was a 6/10.   OB History     Gravida  3   Para  1   Term  1   Preterm      AB  1   Living  1      SAB      IAB      Ectopic      Multiple  0   Live Births  1           Past Medical History:  Diagnosis Date  . Anxiety   . Heart murmur   . Hyperthyroidism   . Thyroid  disease   . Trichimoniasis     Past Surgical History:  Procedure Laterality Date  . NO PAST SURGERIES      Family History  Problem Relation Age of Onset  . Hypertension Mother   . Sickle cell anemia Father   . Cancer Paternal Aunt   . Sickle cell anemia Maternal Grandfather   . Thyroid  disease Paternal Grandmother   . Sickle cell anemia Paternal Grandfather   . Alcohol abuse Neg Hx   . Arthritis Neg Hx   . Asthma Neg Hx   . Birth defects Neg Hx   . COPD Neg Hx   . Depression Neg Hx   . Diabetes Neg Hx   . Drug abuse Neg Hx   . Early death Neg Hx   . Hearing loss Neg Hx   . Heart disease Neg Hx   . Hyperlipidemia Neg Hx   . Kidney disease Neg Hx   . Learning disabilities Neg Hx   . Mental illness Neg Hx   . Mental retardation Neg Hx   . Miscarriages / Stillbirths Neg Hx   . Stroke  Neg Hx   . Vision loss Neg Hx   . Varicose Veins Neg Hx     Social History   Tobacco Use  . Smoking status: Former    Current packs/day: 0.25    Types: Cigarettes  . Smokeless tobacco: Current  Vaping Use  . Vaping status: Every Day  . Substances: Nicotine  Substance Use Topics  . Alcohol use: Yes    Comment: social  . Drug use: No    Allergies:  Allergies  Allergen Reactions  . Blueberry [Vaccinium Angustifolium] Hives         Medications Prior to Admission  Medication Sig Dispense Refill Last Dose/Taking  . CHLOROPHYLL PO Take by mouth. (Patient not taking: Reported on 06/07/2022)     . clotrimazole  (LOTRIMIN ) 1 % cream Apply 1 Application topically 2 (two) times daily. (Patient not taking: Reported on 06/07/2022) 30 g  0   . ketoconazole  (NIZORAL ) 2 % cream Apply 1 application topically daily. (Patient not taking: Reported on 06/07/2022) 30 g 1   . MACA ROOT PO Take by mouth.     . metroNIDAZOLE  (METROGEL ) 0.75 % vaginal gel Place 1 Applicatorful vaginally at bedtime for 7 days. 70 g 0   . Multiple Vitamin (MULTIVITAMIN WITH MINERALS) TABS tablet Take 1 tablet by mouth daily.     . neomycin -polymyxin-hydrocortisone (CORTISPORIN) OTIC solution Place 3 drops into the right ear 4 (four) times daily. X 5-7 days (Patient not taking: Reported on 03/18/2023) 10 mL 0   . ondansetron  (ZOFRAN ) 4 MG tablet Take 1 tablet (4 mg total) by mouth every 6 (six) hours. (Patient not taking: Reported on 03/18/2023) 12 tablet 0   . OVER THE COUNTER MEDICATION Take 1 capsule by mouth daily. Seamoss (Patient not taking: Reported on 06/07/2022)     . psyllium (METAMUCIL SMOOTH TEXTURE) 58.6 % powder Take 1 packet by mouth 3 (three) times daily. 283 g 12     Review of Systems  Gastrointestinal:  Negative for constipation, diarrhea, nausea and vomiting.  Genitourinary:  Positive for dyspareunia (Pressure), vaginal bleeding and vaginal discharge. Negative for difficulty urinating and dysuria.    Physical Exam   Blood pressure 112/60, pulse 82, temperature 98.3 F (36.8 C), resp. rate 18, height 5' 2 (1.575 m), weight 55.2 kg, last menstrual period 08/15/2023.  Physical Exam Vitals reviewed.  Constitutional:      Appearance: Normal appearance.  HENT:     Head: Normocephalic and atraumatic.  Eyes:     Conjunctiva/sclera: Conjunctivae normal.  Cardiovascular:     Rate and Rhythm: Normal rate.  Pulmonary:     Effort: Pulmonary effort is normal.  Musculoskeletal:        General: Normal range of motion.     Cervical back: Normal range of motion.  Skin:    General: Skin is warm and dry.  Neurological:     Mental Status: She is alert and oriented to person, place, and time.  Psychiatric:        Mood and Affect: Mood normal.        Behavior: Behavior normal.     MAU Course  Procedures Results for orders placed or performed during the hospital encounter of 09/21/23 (from the past 24 hours)  Pregnancy, urine POC     Status: Abnormal   Collection Time: 09/21/23 10:36 PM  Result Value Ref Range   Preg Test, Ur POSITIVE (A) NEGATIVE  CBC     Status: Abnormal   Collection Time: 09/21/23 11:04 PM  Result Value Ref Range   WBC 8.0 4.0 - 10.5 K/uL   RBC 4.86 3.87 - 5.11 MIL/uL   Hemoglobin 11.9 (L) 12.0 - 15.0 g/dL   HCT 64.7 (L) 63.9 - 53.9 %   MCV 72.4 (L) 80.0 - 100.0 fL   MCH 24.5 (L) 26.0 - 34.0 pg   MCHC 33.8 30.0 - 36.0 g/dL   RDW 85.3 88.4 - 84.4 %   Platelets 239 150 - 400 K/uL   nRBC 0.0 0.0 - 0.2 %  ABO/Rh     Status: None   Collection Time: 09/21/23 11:04 PM  Result Value Ref Range   ABO/RH(D) O POS    No rh immune globuloin      NOT A RH IMMUNE GLOBULIN CANDIDATE, PT RH POSITIVE Performed at [facility], [address], [place], [place] [address]   hCG, quantitative, pregnancy  Status: Abnormal   Collection Time: 09/21/23 11:04 PM  Result Value Ref Range   hCG, Beta Chain, Quant, S 152 (H) <5 mIU/mL  Comprehensive metabolic panel with  GFR     Status: Abnormal   Collection Time: 09/21/23 11:04 PM  Result Value Ref Range   Sodium 137 135 - 145 mmol/L   Potassium 3.5 3.5 - 5.1 mmol/L   Chloride 104 98 - 111 mmol/L   CO2 21 (L) 22 - 32 mmol/L   Glucose, Bld 92 70 - 99 mg/dL   BUN 8 6 - 20 mg/dL   Creatinine, Ser 9.45 0.44 - 1.00 mg/dL   Calcium 9.0 8.9 - 89.6 mg/dL   Total Protein 6.9 6.5 - 8.1 g/dL   Albumin 3.7 3.5 - 5.0 g/dL   AST 21 15 - 41 U/L   ALT 15 0 - 44 U/L   Alkaline Phosphatase 50 38 - 126 U/L   Total Bilirubin 0.7 0.0 - 1.2 mg/dL   GFR, Estimated >39 >39 mL/min   Anion gap 12 5 - 15  Urinalysis, Routine w reflex microscopic -Urine, Clean Catch     Status: Abnormal   Collection Time: 09/21/23 11:12 PM  Result Value Ref Range   Color, Urine YELLOW YELLOW   APPearance CLOUDY (A) CLEAR   Specific Gravity, Urine 1.023 1.005 - 1.030   pH 6.0 5.0 - 8.0   Glucose, UA NEGATIVE NEGATIVE mg/dL   Hgb urine dipstick LARGE (A) NEGATIVE   Bilirubin Urine NEGATIVE NEGATIVE   Ketones, ur NEGATIVE NEGATIVE mg/dL   Protein, ur NEGATIVE NEGATIVE mg/dL   Nitrite NEGATIVE NEGATIVE   Leukocytes,Ua NEGATIVE NEGATIVE   RBC / HPF >50 0 - 5 RBC/hpf   WBC, UA 0-5 0 - 5 WBC/hpf   Bacteria, UA RARE (A) NONE SEEN   Squamous Epithelial / HPF 6-10 0 - 5 /HPF   Mucus PRESENT     MDM Physical Exam Cultures: Wet Prep and GC/CT Labs: UA, UPT, CBC, CMP, hCG, ABO Ultrasound Assessment and Plan  28 year old G3P1011 at 5.2 weeks ***  -Reviewed POC with patient. -Exam performed and findings discussed.  -*** -*** -***   [name] 09/21/2023, 10:59 PM   Reassessment (12:00 AM)

## 2023-09-23 LAB — GC/CHLAMYDIA PROBE AMP (~~LOC~~) NOT AT ARMC
Chlamydia: NEGATIVE
Comment: NEGATIVE
Comment: NORMAL
Neisseria Gonorrhea: NEGATIVE

## 2023-09-24 ENCOUNTER — Ambulatory Visit: Payer: MEDICAID

## 2023-09-24 ENCOUNTER — Ambulatory Visit: Payer: Self-pay | Admitting: Obstetrics and Gynecology

## 2023-09-24 VITALS — BP 112/73 | HR 99

## 2023-09-24 DIAGNOSIS — O3680X0 Pregnancy with inconclusive fetal viability, not applicable or unspecified: Secondary | ICD-10-CM

## 2023-09-24 DIAGNOSIS — Z3A01 Less than 8 weeks gestation of pregnancy: Secondary | ICD-10-CM | POA: Diagnosis not present

## 2023-09-24 LAB — BETA HCG QUANT (REF LAB): hCG Quant: 120 m[IU]/mL

## 2023-09-24 NOTE — Progress Notes (Signed)
 Pt presents for STAT BHCG. Pt reports scant, brown bleeding, in which she is using a panty liner for. Denies abdominal pain at this time. Pt took 1,000 mg of tylenol  last night. Pt reports that she used metrogel  on 09/20/23 and started experiencing bleeding on 09/21/23. She is inquiring if this is related. RN reassured pt that metrogel  is safe to use during pregnancy. RN advised that once lab results are back, she will be contacted with the next steps.

## 2023-09-26 ENCOUNTER — Encounter (INDEPENDENT_AMBULATORY_CARE_PROVIDER_SITE_OTHER): Payer: MEDICAID | Admitting: Primary Care

## 2023-09-26 ENCOUNTER — Other Ambulatory Visit: Payer: MEDICAID

## 2023-09-26 VITALS — BP 111/79 | HR 72

## 2023-09-26 DIAGNOSIS — O3680X0 Pregnancy with inconclusive fetal viability, not applicable or unspecified: Secondary | ICD-10-CM

## 2023-09-26 NOTE — Progress Notes (Signed)
 Pt presents for stat hcg. Pt reports being anxious due to concerns of ectopic pregnancy. Pt provided reassurance that the results will come back stat and we will contact her as soon as we have the results and the provider reviews. Pt does reporting bleeding, but no severe pain.

## 2023-09-27 ENCOUNTER — Telehealth: Payer: Self-pay

## 2023-09-27 LAB — BETA HCG QUANT (REF LAB): hCG Quant: 83 m[IU]/mL

## 2023-09-27 NOTE — Telephone Encounter (Signed)
 TC to pt about HCG results, informed that HCG did go down. Pt will return in one week for repeat lab draw.

## 2023-09-30 ENCOUNTER — Ambulatory Visit: Payer: Self-pay | Admitting: Obstetrics and Gynecology

## 2023-10-03 ENCOUNTER — Other Ambulatory Visit: Payer: MEDICAID

## 2023-10-10 ENCOUNTER — Encounter (INDEPENDENT_AMBULATORY_CARE_PROVIDER_SITE_OTHER): Payer: MEDICAID | Admitting: Primary Care

## 2023-10-15 ENCOUNTER — Encounter: Payer: Self-pay | Admitting: Physician Assistant

## 2023-10-15 ENCOUNTER — Inpatient Hospital Stay (INDEPENDENT_AMBULATORY_CARE_PROVIDER_SITE_OTHER): Payer: MEDICAID | Admitting: Primary Care

## 2023-10-15 ENCOUNTER — Telehealth: Payer: MEDICAID | Admitting: Physician Assistant

## 2023-10-15 DIAGNOSIS — L739 Follicular disorder, unspecified: Secondary | ICD-10-CM | POA: Diagnosis not present

## 2023-10-15 MED ORDER — DOXYCYCLINE HYCLATE 100 MG PO CAPS: 100.0000 mg | ORAL_CAPSULE | Freq: Two times a day (BID) | ORAL | 0 refills | Status: AC

## 2023-10-15 MED ORDER — CLINDAMYCIN PHOS (TWICE-DAILY) 1 % EX GEL: CUTANEOUS | 0 refills | Status: DC

## 2023-10-15 NOTE — Patient Instructions (Signed)
  [employer], thank you for joining Mackenzie Richert, PA-C for today's virtual visit.  While this provider is not your primary care provider (PCP), if your PCP is located in our provider database this encounter information will be shared with them immediately following your visit.   A [facility] MyChart account gives you access to today's visit and all your visits, tests, and labs performed at [facility]  click here if you don't have a [facility] MyChart account or go to [email or web address]  Consent: (Patient) Mackenzie Santiago provided verbal consent for this virtual visit at the beginning of the encounter.  Current Medications:  Current Outpatient Medications:    acetaminophen  (TYLENOL ) 500 MG tablet, Take 1,000 mg by mouth every 6 (six) hours as needed for mild pain (pain score 1-3) or moderate pain (pain score 4-6)., Disp: , Rfl:    Medications ordered in this encounter:  No orders of the defined types were placed in this encounter.    *If you need refills on other medications prior to your next appointment, please contact your pharmacy*  Follow-Up: Call back or seek an in-person evaluation if the symptoms worsen or if the condition fails to improve as anticipated.  [facility] [phone]  Other Instructions Wash the infected skin twice daily with an antibacterial soap or cleanser, such as one containing benzoyl peroxide.  Always use a clean washcloth and towel for each wash, and wash them in hot, soapy water.  Apply warm, moist compresses to the affected area several times a day for 10-15 minutes to help with comfort and drainage.  Do not scratch the affected skin, as this can lead to increased irritation and potentially spread the infection.  Please seek an in-person evaluation if the symptoms worsen or if the condition fails to improve as anticipated.  PCP follow-up in 5-7 days   If you have been instructed to have an in-person  evaluation today at a local Urgent Care facility, please use the link below. It will take you to a list of all of our available [facility], including address, phone number and hours of operation. Please do not delay care.  [facility]  If you or a family member do not have a primary care provider, use the link below to schedule a visit and establish care. When you choose a [facility] primary care physician or advanced practice provider, you gain a long-term partner in health. Find a Primary Care Provider  Learn more about [facility]'s in-office and virtual care options: [name] Get Care Now

## 2023-10-15 NOTE — Progress Notes (Signed)
 Virtual Visit Consent   Mackenzie Santiago, you are scheduled for a virtual visit with a [facility] provider today. Just as with appointments in the office, your consent must be obtained to participate. Your consent will be active for this visit and any virtual visit you may have with one of our providers in the next 365 days. If you have a MyChart account, a copy of this consent can be sent to you electronically.  As this is a virtual visit, video technology does not allow for your provider to perform a traditional examination. This may limit your provider's ability to fully assess your condition. If your provider identifies any concerns that need to be evaluated in person or the need to arrange testing (such as labs, EKG, etc.), we will make arrangements to do so. Although advances in technology are sophisticated, we cannot ensure that it will always work on either your end or our end. If the connection with a video visit is poor, the visit may have to be switched to a telephone visit. With either a video or telephone visit, we are not always able to ensure that we have a secure connection.  By engaging in this virtual visit, you consent to the provision of healthcare and authorize for your insurance to be billed (if applicable) for the services provided during this visit. Depending on your insurance coverage, you may receive a charge related to this service.  I need to obtain your verbal consent now. Are you willing to proceed with your visit today? [name] has provided verbal consent on 10/15/2023 for a virtual visit (video or telephone). Rohail Klees, PA-C  Date: 10/15/2023 2:23 PM   Virtual Visit via Video Note   I, Mackenzie Santiago, connected with  Mackenzie Santiago  ([id number], 08/09/1995) on 10/15/23 at  2:00 PM EDT by a video-enabled telemedicine application and verified that I am speaking with the correct person using two identifiers.  Location: Patient: Virtual Visit Location  Patient: Home Provider: Virtual Visit Location Provider: Home Office   I discussed the limitations of evaluation and management by telemedicine and the availability of in person appointments. The patient expressed understanding and agreed to proceed.    History of Present Illness: Mackenzie Santiago is a 28 y.o. who identifies as a female who was assigned female at birth, and is being seen today for painful bumps on scalp.  HPI: Painful, pus filled bumps on the scalp for the last few weeks. Symptoms are worsening.  Reports no change in use of hair products, recent trauma.  Patient reports that she went to an aesthetician, and was informed that it could be a staph infection.  She reports no history of similar symptoms in the past.  She denies any fevers, chills, nausea, vomiting, neck pain.      Problems:  Patient Active Problem List   Diagnosis Date Noted   Hyperthyroidism 06/26/2020   Obstetric labial laceration, delivered, current hospitalization 11/23/2015   H/O anxiety state 11/22/2015   H/O sickle cell trait (FOB neg) 11/22/2015   H/O: depression 11/21/2015   Alcohol abuse 03/23/2014   Smoker 03/23/2014   Thyroid  dysfunction 05/07/2011   Tonsillar hypertrophy 05/07/2011   Abnormal weight loss 02/19/2011    Allergies:  Allergies  Allergen Reactions   Blueberry [Vaccinium Angustifolium] Hives        Medications:  Current Outpatient Medications:    clindamycin (CLINDAGEL) 1 % gel, Apply to the affected area twice daily for 7 days, Disp: 30 g, Rfl: 0  doxycycline  (VIBRAMYCIN ) 100 MG capsule, Take 1 capsule (100 mg total) by mouth 2 (two) times daily for 10 days., Disp: 20 capsule, Rfl: 0   acetaminophen  (TYLENOL ) 500 MG tablet, Take 1,000 mg by mouth every 6 (six) hours as needed for mild pain (pain score 1-3) or moderate pain (pain score 4-6)., Disp: , Rfl:   Observations/Objective: Patient is well-developed, well-nourished in no acute distress.  Resting comfortably  at  home.  Head: images attached to this visit, scatters areas of pustular lesions, scabbed over, no obvious abscess noted No labored breathing.  Speech is clear and coherent with logical content.  Patient is alert and oriented at baseline.  ROM of the neck  Assessment and Plan: 1. Folliculitis (Primary) - doxycycline  (VIBRAMYCIN ) 100 MG capsule; Take 1 capsule (100 mg total) by mouth 2 (two) times daily for 10 days.  Dispense: 20 capsule; Refill: 0 - clindamycin (CLINDAGEL) 1 % gel; Apply to the affected area twice daily for 7 days  Dispense: 30 g; Refill: 0  Wash the infected skin twice daily with an antibacterial soap or cleanser, such as one containing benzoyl peroxide.  Always use a clean washcloth and towel for each wash, and wash them in hot, soapy water.  Apply warm, moist compresses to the affected area several times a day for 10-15 minutes to help with comfort and drainage.  Do not scratch the affected skin, as this can lead to increased irritation and potentially spread the infection.  Please seek an in-person evaluation if the symptoms worsen or if the condition fails to improve as anticipated.  PCP follow-up in 5-7 days  Follow Up Instructions: I discussed the assessment and treatment plan with the patient. The patient was provided an opportunity to ask questions and all were answered. The patient agreed with the plan and demonstrated an understanding of the instructions.  A copy of instructions were sent to the patient via MyChart unless otherwise noted below.    The patient was advised to call back or seek an in-person evaluation if the symptoms worsen or if the condition fails to improve as anticipated.    [name], PA-C

## 2023-10-24 ENCOUNTER — Ambulatory Visit (INDEPENDENT_AMBULATORY_CARE_PROVIDER_SITE_OTHER): Payer: MEDICAID | Admitting: Obstetrics & Gynecology

## 2023-10-24 ENCOUNTER — Encounter: Payer: Self-pay | Admitting: Obstetrics & Gynecology

## 2023-10-24 VITALS — BP 96/65 | HR 96 | Ht 61.0 in | Wt 128.2 lb

## 2023-10-24 DIAGNOSIS — O039 Complete or unspecified spontaneous abortion without complication: Secondary | ICD-10-CM

## 2023-10-24 NOTE — Progress Notes (Signed)
 Patient ID: Mackenzie Santiago, female   DOB: Nov 25, 1995, 28 y.o.   MRN: [id number] Ultrasounds Results Note  SUBJECTIVE HPI:  [name] is a 28 y.o. G3P1011 at [redacted]w[redacted]d by LMP who presents to the [facility] for followup ultrasound results. She was dx with miscarriage with no IUP Past Medical History:  Diagnosis Date   Anxiety    Heart murmur    Hyperthyroidism    Thyroid  disease    Trichimoniasis    Past Surgical History:  Procedure Laterality Date   NO PAST SURGERIES     Social History   Socioeconomic History   Marital status: Single    Spouse name: Not on file   Number of children: 1   Years of education: Not on file   Highest education level: Not on file  Occupational History   Not on file  Tobacco Use   Smoking status: Former    Current packs/day: 0.25    Types: Cigarettes   Smokeless tobacco: Current  Vaping Use   Vaping status: Every Day   Substances: Nicotine  Substance and Sexual Activity   Alcohol use: Yes    Comment: social   Drug use: No   Sexual activity: Yes    Birth control/protection: None, Condom  Other Topics Concern   Not on file  Social History Narrative   Not on file   Social Drivers of Health   Financial Resource Strain: Low Risk  (10/05/2022)   Overall Financial Resource Strain (CARDIA)    Difficulty of Paying Living Expenses: Not hard at all  Food Insecurity: No Food Insecurity (10/05/2022)   Hunger Vital Sign    Worried About Running Out of Food in the Last Year: Never true    Ran Out of Food in the Last Year: Never true  Transportation Needs: No Transportation Needs (10/05/2022)   PRAPARE - Administrator, Civil Service (Medical): No    Lack of Transportation (Non-Medical): No  Physical Activity: Insufficiently Active (10/05/2022)   Exercise Vital Sign    Days of Exercise per Week: 2 days    Minutes of Exercise per Session: 60 min  Stress: Stress Concern Present (10/05/2022)   [employer] of  Occupational Health - Occupational Stress Questionnaire    Feeling of Stress : Very much  Social Connections: Socially Isolated (10/05/2022)   Social Connection and Isolation Panel    Frequency of Communication with Friends and Family: Never    Frequency of Social Gatherings with Friends and Family: Never    Attends Religious Services: Never    Database administrator or Organizations: No    Attends Banker Meetings: Never    Marital Status: Never married  Intimate Partner Violence: Not At Risk (10/05/2022)   Humiliation, Afraid, Rape, and Kick questionnaire    Fear of Current or Ex-Partner: No    Emotionally Abused: No    Physically Abused: No    Sexually Abused: No   Current Outpatient Medications on File Prior to Visit  Medication Sig Dispense Refill   clindamycin (CLINDAGEL) 1 % gel Apply to the affected area twice daily for 7 days 30 g 0   doxycycline  (VIBRAMYCIN ) 100 MG capsule Take 1 capsule (100 mg total) by mouth 2 (two) times daily for 10 days. 20 capsule 0   acetaminophen  (TYLENOL ) 500 MG tablet Take 1,000 mg by mouth every 6 (six) hours as needed for mild pain (pain score 1-3) or moderate pain (pain score 4-6).  No current facility-administered medications on file prior to visit.   Allergies  Allergen Reactions   Blueberry [Vaccinium Angustifolium] Hives         I have reviewed patient's Past Medical Hx, Surgical Hx, Family Hx, Social Hx, medications and allergies.   Review of Systems Review of Systems  Constitutional: Negative for fever and chills.  Gastrointestinal: Negative for nausea, vomiting, abdominal pain, diarrhea and constipation.  Genitourinary: Negative for dysuria.  Musculoskeletal: Negative for back pain.  Neurological: Negative for dizziness and weakness.    Physical Exam  BP 96/65   Pulse 96   Ht 5' 1 (1.549 m)   Wt 128 lb 3.2 oz (58.2 kg)   LMP 08/15/2023   Breastfeeding No   BMI 24.22 kg/m   GENERAL: Well-developed,  well-nourished female in no acute distress.  HEENT: Normocephalic, atraumatic.   LUNGS: Effort normal ABDOMEN: soft, non-tender HEART: Regular rate  SKIN: Warm, dry and without erythema PSYCH: Normal mood and affect NEURO: Alert and oriented x 4  LAB RESULTS No results found for this or any previous visit (from the past 24 hours).  IMAGING EXAM: OBSTETRIC ULTRASOUND FIRST TRIMESTER   TECHNIQUE: Transabdominal and Transvaginal first trimester obstetric pelvic ultrasound was performed.   COMPARISON: 09/09/2013   CLINICAL HISTORY: Abdominal cramping affecting pregnancy.   FINDINGS:   UTERUS: No intrauterine gestational sac.   YOLK SAC: Not visualized.   EMBRYO(<11WK) /FETUS(>=11WK): Not visualized.   RIGHT OVARY: Unremarkable.   LEFT OVARY: Corpus luteum cyst.   FREE FLUID: Small amount of free fluid in the pelvis.     IMPRESSION: 1. No visualized intrauterine gestational sac. In the setting of a positive pregnancy test, this reflects a pregnancy of unknown location. Differential considerations include early normal intrauterine pregnancy, abnormal intrauterine pregnancy, or nonvisualized ectopic pregnancy. Differentiation is achieved with serial beta hcg supplemented by repeat sonography as clinically warranted.   Electronically signed by: Norman Gatlin MD 09/21/2023 11:51 PM EDT RP Workstation: [id number]  ASSESSMENT 1. Miscarriage     PLAN Discharge home in stable condition    Eveline Lynwood MATSU, MD  10/24/2023  8:50 AM

## 2023-10-24 NOTE — Progress Notes (Signed)
 SAB f/u. Denies bleeding or cramping. Would like to discuss when cycle will return. Would like swollen lymph node right groin evaluated.

## 2023-10-25 LAB — BETA HCG QUANT (REF LAB): hCG Quant: <1 m[IU]/mL

## 2023-10-26 ENCOUNTER — Ambulatory Visit: Payer: Self-pay | Admitting: Obstetrics & Gynecology

## 2023-10-31 ENCOUNTER — Telehealth: Payer: MEDICAID | Admitting: Physician Assistant

## 2023-10-31 DIAGNOSIS — H6992 Unspecified Eustachian tube disorder, left ear: Secondary | ICD-10-CM | POA: Diagnosis not present

## 2023-10-31 MED ORDER — METHYLPREDNISOLONE 4 MG PO TBPK: ORAL_TABLET | ORAL | 0 refills | Status: DC

## 2023-10-31 NOTE — Progress Notes (Signed)
 Virtual Visit Consent   Mackenzie Santiago, you are scheduled for a virtual visit with a [facility] provider today. Just as with appointments in the office, your consent must be obtained to participate. Your consent will be active for this visit and any virtual visit you may have with one of our providers in the next 365 days. If you have a MyChart account, a copy of this consent can be sent to you electronically.  As this is a virtual visit, video technology does not allow for your provider to perform a traditional examination. This may limit your provider's ability to fully assess your condition. If your provider identifies any concerns that need to be evaluated in person or the need to arrange testing (such as labs, EKG, etc.), we will make arrangements to do so. Although advances in technology are sophisticated, we cannot ensure that it will always work on either your end or our end. If the connection with a video visit is poor, the visit may have to be switched to a telephone visit. With either a video or telephone visit, we are not always able to ensure that we have a secure connection.  By engaging in this virtual visit, you consent to the provision of healthcare and authorize for your insurance to be billed (if applicable) for the services provided during this visit. Depending on your insurance coverage, you may receive a charge related to this service.  I need to obtain your verbal consent now. Are you willing to proceed with your visit today? Mackenzie Santiago has provided verbal consent on 10/31/2023 for a virtual visit (video or telephone). Mackenzie Santiago, [place]  Date: 10/31/2023 7:07 PM   Virtual Visit via Video Note   I, Mackenzie Santiago, connected with  Mackenzie Santiago  ([id number], [birth year]) on 10/31/23 at  6:30 PM EDT by a video-enabled telemedicine application and verified that I am speaking with the correct person using two identifiers.  Location: Patient: Virtual Visit Location  Patient: Home Provider: Virtual Visit Location Provider: Home Office   I discussed the limitations of evaluation and management by telemedicine and the availability of in person appointments. The patient expressed understanding and agreed to proceed.    History of Present Illness: Mackenzie Santiago is a Mackenzie y.o. who identifies as a female who was assigned female at birth, and is being seen today for left ear fullness.  HPI: Mackenzie Santiago presents for a telehealth video visit for c/o left ear feeling of muffled sounds and ringing in the ear. She states ringing is chronic, but the muffled sound is somewhat new over the past few days. No dizziness. No head or neck trauma. No open water activities. No recent air travel. No drainage from the ear. No fever. No cold symptoms.      Problems:  Patient Active Problem List   Diagnosis Date Noted   Hyperthyroidism 06/26/2020   H/O anxiety state 11/22/2015   H/O sickle cell trait (FOB neg) 11/22/2015   H/O: depression 11/21/2015   Alcohol abuse 03/23/2014   Smoker 03/23/2014   Thyroid  dysfunction 05/07/2011   Tonsillar hypertrophy 05/07/2011   Abnormal weight loss 02/19/2011    Allergies:  Allergies  Allergen Reactions   Blueberry [Vaccinium Angustifolium] Hives        Medications:  Current Outpatient Medications:    methylPREDNISolone (MEDROL DOSEPAK) 4 MG TBPK tablet, Take as directed, Disp: 1 each, Rfl: 0   acetaminophen  (TYLENOL ) 500 MG tablet, Take 1,000 mg by mouth every 6 (six) hours as needed for  mild pain (pain score 1-3) or moderate pain (pain score 4-6)., Disp: , Rfl:    clindamycin (CLINDAGEL) 1 % gel, Apply to the affected area twice daily for 7 days, Disp: 30 g, Rfl: 0  Observations/Objective: Patient is well-developed, well-nourished in no acute distress.  Resting comfortably  at home.  Head is normocephalic, atraumatic.  No labored breathing.  Speech is clear and coherent with logical content.  Patient is alert and oriented  at baseline.    Assessment and Plan: 1. Dysfunction of left eustachian tube (Primary) - methylPREDNISolone (MEDROL DOSEPAK) 4 MG TBPK tablet; Take as directed  Dispense: 1 each; Refill: 0  Start medicine. Continue to watch for worsening symptoms. Santiago/u with PCP or go to UC for the ear inspection to r/o infection vs cerumen impaction. Schedule a virtual appointment or follow up at an urgent care clinic if symptoms don't improve.  Pt verbalized understanding and in agreement.    Follow Up Instructions: I discussed the assessment and treatment plan with the patient. The patient was provided an opportunity to ask questions and all were answered. The patient agreed with the plan and demonstrated an understanding of the instructions.  A copy of instructions were sent to the patient via MyChart unless otherwise noted below.   Patient has requested to receive PHI (AVS, Work Notes, etc) pertaining to this video visit through e-mail as they are currently without active MyChart. They have voiced understand that email is not considered secure and their health information could be viewed by someone other than the patient.   The patient was advised to call back or seek an in-person evaluation if the symptoms worsen or if the condition fails to improve as anticipated.    Edelin Fryer, PA-C

## 2023-10-31 NOTE — Patient Instructions (Signed)
  [employer], thank you for joining Lovette Borg, PA-C for today's virtual visit.  While this provider is not your primary care provider (PCP), if your PCP is located in our provider database this encounter information will be shared with them immediately following your visit.   A [facility] MyChart account gives you access to today's visit and all your visits, tests, and labs performed at [facility]  click here if you don't have a [facility] MyChart account or go to [email or web address]  Consent: (Patient) Mackenzie Santiago provided verbal consent for this virtual visit at the beginning of the encounter.  Current Medications:  Current Outpatient Medications:    methylPREDNISolone (MEDROL DOSEPAK) 4 MG TBPK tablet, Take as directed, Disp: 1 each, Rfl: 0   acetaminophen  (TYLENOL ) 500 MG tablet, Take 1,000 mg by mouth every 6 (six) hours as needed for mild pain (pain score 1-3) or moderate pain (pain score 4-6)., Disp: , Rfl:    clindamycin (CLINDAGEL) 1 % gel, Apply to the affected area twice daily for 7 days, Disp: 30 g, Rfl: 0   Medications ordered in this encounter:  Meds ordered this encounter  Medications   methylPREDNISolone (MEDROL DOSEPAK) 4 MG TBPK tablet    Sig: Take as directed    Dispense:  1 each    Refill:  0    Supervising Provider:   BLAISE ALEENE KIDD [[id number]]     *If you need refills on other medications prior to your next appointment, please contact your pharmacy*  Follow-Up: Call back or seek an in-person evaluation if the symptoms worsen or if the condition fails to improve as anticipated.  [facility] [phone]  Other Instructions Start medicine. Continue to watch for worsening symptoms. F/u with PCP or go to UC for the ear inspection to r/o infection vs cerumen impaction. Schedule a virtual appointment or follow up at an urgent care clinic if symptoms don't improve.    If you have been instructed to have an  in-person evaluation today at a local Urgent Care facility, please use the link below. It will take you to a list of all of our available [facility], including address, phone number and hours of operation. Please do not delay care.  [facility]  If you or a family member do not have a primary care provider, use the link below to schedule a visit and establish care. When you choose a [facility] primary care physician or advanced practice provider, you gain a long-term partner in health. Find a Primary Care Provider  Learn more about [facility]'s in-office and virtual care options: [facility] - Get Care Now

## 2023-11-25 ENCOUNTER — Telehealth (INDEPENDENT_AMBULATORY_CARE_PROVIDER_SITE_OTHER): Payer: Self-pay | Admitting: Primary Care

## 2023-11-25 NOTE — Telephone Encounter (Signed)
 Called pt to reschedule appt. Pt picked up the phone and hung up on me. Appt will be canceled.

## 2023-11-27 ENCOUNTER — Encounter (INDEPENDENT_AMBULATORY_CARE_PROVIDER_SITE_OTHER): Payer: MEDICAID | Admitting: Primary Care

## 2024-01-27 ENCOUNTER — Telehealth (INDEPENDENT_AMBULATORY_CARE_PROVIDER_SITE_OTHER): Payer: Self-pay | Admitting: Primary Care

## 2024-01-27 NOTE — Telephone Encounter (Signed)
 Left VM with pt about their upcoming appt. Pt did not answer Appt details left on VM

## 2024-01-31 ENCOUNTER — Telehealth: Payer: Self-pay | Admitting: Primary Care

## 2024-01-31 NOTE — Telephone Encounter (Signed)
 01/31/24 Left VM confirming appointment and address

## 2024-02-03 ENCOUNTER — Telehealth: Payer: Self-pay | Admitting: Primary Care

## 2024-02-03 NOTE — Telephone Encounter (Signed)
 Left VM with pt about their upcoming appt. Pt did not answer Pt informed to come to [facility].

## 2024-02-04 ENCOUNTER — Encounter (INDEPENDENT_AMBULATORY_CARE_PROVIDER_SITE_OTHER): Payer: Self-pay | Admitting: Primary Care

## 2024-02-04 ENCOUNTER — Ambulatory Visit (INDEPENDENT_AMBULATORY_CARE_PROVIDER_SITE_OTHER): Payer: MEDICAID | Admitting: Primary Care

## 2024-02-04 VITALS — BP 120/81 | HR 87 | Resp 16 | Ht 63.0 in | Wt 116.2 lb

## 2024-02-04 DIAGNOSIS — N979 Female infertility, unspecified: Secondary | ICD-10-CM | POA: Diagnosis not present

## 2024-02-04 DIAGNOSIS — N898 Other specified noninflammatory disorders of vagina: Secondary | ICD-10-CM

## 2024-02-04 DIAGNOSIS — N912 Amenorrhea, unspecified: Secondary | ICD-10-CM | POA: Diagnosis not present

## 2024-02-04 DIAGNOSIS — F509 Eating disorder, unspecified: Secondary | ICD-10-CM

## 2024-02-04 NOTE — Progress Notes (Signed)
 " [facility]  Mackenzie Santiago, is a 29 y.o. female  RDW:[id number]  FMW:[id number]  DOB - Dec 21, 1995  Chief Complaint  Patient presents with   Menstrual Problem    Pt states when she went to the obgyn they told her that she desn't have pcos   Pt is requesting referral to infertility    Breast Problem    Took nipple ring out swelling and itches thinks because she took her nipple ring out    Anorexia    Pt states she doesn't have an appetite at all she can't eat        Subjective:   Mackenzie Santiago is a 29 y.o. female here today for an acute visit.  Patient voices several concerns manage amenorrhea, she has a 18-year-old son not using any contraception and has not been able to get pregnant but wants September 2025 where she had a miscarriage.  She is questioning her fertility this will need to be referred to GYN.  However when she talked to Dr. Eveline he basically felt that there was no issue or problem for her to get pregnant. She is followed by mental health and on medication prescribed outside of office.  Question about no desire to eat she has to force to make herself eat wonder if this was due to her mental health problems. ([facility] case manger resources for therapy which she is not convinced they are not  I don't know)   No problems updated.  Comprehensive ROS Pertinent positive and negative noted in HPI   Allergies[1]  Past Medical History:  Diagnosis Date   Anxiety    Heart murmur    Hyperthyroidism    Thyroid  disease    Trichimoniasis     Medications Ordered Prior to Encounter[2] Health Maintenance  Topic Date Due   COVID-19 Vaccine (1) Never done   Hepatitis B Vaccine (1 of 3 - 19+ 3-dose series) Never done   Flu Shot  04/14/2024*   Pneumococcal Vaccine (1 of 2 - PCV) 02/03/2025*   DTaP/Tdap/Td vaccine (2 - Td or Tdap) 05/15/2024   Pap Smear  06/06/2025   HPV Vaccine  Completed   Hepatitis C Screening  Completed   HIV Screening   Completed   Meningitis B Vaccine  Aged Out  *Topic was postponed. The date shown is not the original due date.    Objective:   Vitals:   02/04/24 1425  BP: 120/81  Pulse: 87  Resp: 16  SpO2: 99%  Weight: 116 lb 3.2 oz (52.7 kg)  Height: 5' 3 (1.6 m)   BP Readings from Last 3 Encounters:  02/04/24 120/81  10/24/23 96/65  09/26/23 111/79      Physical Exam Vitals reviewed.  Constitutional:      Appearance: Normal appearance. She is normal weight.  HENT:     Head: Normocephalic.     Right Ear: Tympanic membrane, ear canal and external ear normal.     Left Ear: Tympanic membrane, ear canal and external ear normal.     Nose: Nose normal.     Mouth/Throat:     Mouth: Mucous membranes are dry.  Eyes:     Extraocular Movements: Extraocular movements intact.     Pupils: Pupils are equal, round, and reactive to light.  Cardiovascular:     Rate and Rhythm: Normal rate.  Pulmonary:     Effort: Pulmonary effort is normal.     Breath sounds: Normal breath sounds.  Chest:  Comments: No s/s of infection on left nipple after removal of nipple ring. Did SBE and taught patient Abdominal:     General: Bowel sounds are normal.     Palpations: Abdomen is soft.  Musculoskeletal:        General: Normal range of motion.     Cervical back: Normal range of motion.  Skin:    General: Skin is warm and dry.  Neurological:     Mental Status: She is alert and oriented to person, place, and time.  Psychiatric:        Mood and Affect: Mood normal.        Behavior: Behavior normal.        Thought Content: Thought content normal.     Assessment & Plan  Mackenzie Santiago was seen today for menstrual problem, breast problem and anorexia.  Diagnoses and all orders for this visit:  Vaginal discharge -     Cervicovaginal ancillary only  Infertility, female [facility] [address] [place], [place] [address] Phone #[phone]  Amenorrhea -     Ambulatory  referral to Gynecology  Eating disorder, unspecified type No desire to eat does not have a distort self image problem actually would prefer to gain weight.  Suggestive protein shakes, Ensure or any other kind of supplements that add protein and calories.  Will also refer to [facility]      Patient have been counseled extensively about nutrition and exercise. Other issues discussed during this visit include: low cholesterol diet, weight control and daily exercise, foot care, annual eye examinations at Ophthalmology, importance of adherence with medications and regular follow-up. We also discussed long term complications of uncontrolled diabetes and hypertension.   Return for annual physical.  The patient was given clear instructions to go to ER or return to medical center if symptoms don't improve, worsen or new problems develop. The patient verbalized understanding. The patient was told to call to get lab results if they haven't heard anything in the next week.   This note has been created with Education officer, environmental. Any transcriptional errors are unintentional.   Mackenzie SHAUNNA Bohr, NP 02/04/2024, 3:06 PM     [1]  Allergies Allergen Reactions   Blueberry [Vaccinium Angustifolium] Hives       [2]  No current outpatient medications on file prior to visit.   No current facility-administered medications on file prior to visit.   "

## 2024-02-04 NOTE — Patient Instructions (Addendum)
 [facility] [address] [place], [place] [address] Phone #[phone]

## 2024-02-05 ENCOUNTER — Telehealth (INDEPENDENT_AMBULATORY_CARE_PROVIDER_SITE_OTHER): Payer: Self-pay | Admitting: Primary Care

## 2024-02-05 NOTE — Telephone Encounter (Signed)
 Copied from CRM [id number]. Topic: Referral - Question >> Feb 05, 2024 11:25 AM Revonda D wrote: Reason for CRM: Mackenzie Santiago with [facility] is calling to request a referral to [facility] for medication management and psychiatry. Pt would like a callback with an update.

## 2024-02-07 ENCOUNTER — Other Ambulatory Visit (INDEPENDENT_AMBULATORY_CARE_PROVIDER_SITE_OTHER): Payer: Self-pay

## 2024-02-07 DIAGNOSIS — Z8659 Personal history of other mental and behavioral disorders: Secondary | ICD-10-CM

## 2024-02-07 NOTE — Telephone Encounter (Signed)
 Please reach out to pt and make aware referral has been placed for behavioral health and they will reach out to her to schedule

## 2024-02-07 NOTE — Telephone Encounter (Signed)
Called and LVM with pt

## 2024-02-12 ENCOUNTER — Telehealth: Payer: Self-pay | Admitting: *Deleted

## 2024-02-12 NOTE — Progress Notes (Signed)
 Complex Care Management Note  Care Guide Note 02/12/2024 Name: Mackenzie Santiago MRN: [id number] DOB: 02-07-1995  [name] is a 29 y.o. year old female who sees Celestia Rosaline SQUIBB, NP for primary care. I reached out to [employer] by phone today to offer complex care management services.  [name] was given information about Complex Care Management services today including:   The Complex Care Management services include support from the care team which includes your Nurse Care Manager, Clinical Social Worker, or Pharmacist.  The Complex Care Management team is here to help remove barriers to the health concerns and goals most important to you. Complex Care Management services are voluntary, and the patient may decline or stop services at any time by request to their care team member.   Complex Care Management Consent Status: Patient agreed to services and verbal consent obtained.   Follow up plan:  Telephone appointment with complex care management team member scheduled for:  02/17/24  Encounter Outcome:  Patient Scheduled  Harlene Satterfield  [facility]  [facility], [facility] Guide  Direct Dial: [phone]  Fax [phone]

## 2024-02-17 ENCOUNTER — Telehealth: Payer: Self-pay

## 2024-02-17 ENCOUNTER — Other Ambulatory Visit: Payer: MEDICAID

## 2024-02-17 NOTE — Patient Instructions (Signed)
 Eugenie Martin-King - I am sorry I was unable to reach you today for our scheduled appointment. I work with Celestia Rosaline SQUIBB, NP and am calling to support your healthcare needs. Please contact me at [phone] at your earliest convenience. I look forward to speaking with you soon.   Thank you,  Orlean Fey, BSW [facility] Social Worker, [employer] [phone]

## 2024-02-29 ENCOUNTER — Ambulatory Visit (HOSPITAL_COMMUNITY): Payer: MEDICAID | Admitting: Psychiatry

## 2024-09-03 ENCOUNTER — Encounter (INDEPENDENT_AMBULATORY_CARE_PROVIDER_SITE_OTHER): Payer: Self-pay | Admitting: Primary Care
